# Patient Record
Sex: Male | Born: 1935
Health system: Southern US, Community
[De-identification: ages and names within clinical notes are randomized; demographics above are authoritative.]

## PROBLEM LIST (undated history)

## (undated) DIAGNOSIS — I10 Essential (primary) hypertension: Secondary | ICD-10-CM

## (undated) DIAGNOSIS — J449 Chronic obstructive pulmonary disease, unspecified: Secondary | ICD-10-CM

## (undated) DIAGNOSIS — H3530 Unspecified macular degeneration: Secondary | ICD-10-CM

## (undated) HISTORY — DX: Unspecified macular degeneration: H35.30

## (undated) HISTORY — PX: OTHER SURGICAL HISTORY: SHX169

## (undated) HISTORY — DX: Chronic obstructive pulmonary disease, unspecified: J44.9

## (undated) HISTORY — PX: ROTATOR CUFF REPAIR: SHX139

## (undated) HISTORY — PX: LUMBAR SPINE SURGERY: SHX701

---

## 1999-07-22 ENCOUNTER — Encounter: Admission: RE | Admit: 1999-07-22 | Discharge: 1999-08-14 | Payer: Self-pay | Admitting: Anesthesiology

## 1999-11-04 ENCOUNTER — Encounter: Payer: Self-pay | Admitting: Neurosurgery

## 1999-11-10 ENCOUNTER — Ambulatory Visit (HOSPITAL_COMMUNITY): Admission: RE | Admit: 1999-11-10 | Discharge: 1999-11-11 | Payer: Self-pay | Admitting: Neurosurgery

## 1999-11-10 ENCOUNTER — Encounter: Payer: Self-pay | Admitting: Neurosurgery

## 1999-11-12 ENCOUNTER — Emergency Department (HOSPITAL_COMMUNITY): Admission: EM | Admit: 1999-11-12 | Discharge: 1999-11-12 | Payer: Self-pay | Admitting: Emergency Medicine

## 2001-04-19 ENCOUNTER — Encounter: Admission: RE | Admit: 2001-04-19 | Discharge: 2001-04-19 | Payer: Self-pay | Admitting: Internal Medicine

## 2001-04-19 ENCOUNTER — Encounter: Payer: Self-pay | Admitting: Internal Medicine

## 2001-11-01 ENCOUNTER — Encounter: Admission: RE | Admit: 2001-11-01 | Discharge: 2001-11-01 | Payer: Self-pay | Admitting: Internal Medicine

## 2001-11-01 ENCOUNTER — Encounter: Payer: Self-pay | Admitting: Internal Medicine

## 2001-12-08 ENCOUNTER — Encounter: Payer: Self-pay | Admitting: Sports Medicine

## 2001-12-08 ENCOUNTER — Encounter: Admission: RE | Admit: 2001-12-08 | Discharge: 2001-12-08 | Payer: Self-pay | Admitting: Sports Medicine

## 2002-01-09 ENCOUNTER — Encounter: Admission: RE | Admit: 2002-01-09 | Discharge: 2002-01-09 | Payer: Self-pay | Admitting: Internal Medicine

## 2002-01-09 ENCOUNTER — Encounter: Payer: Self-pay | Admitting: Internal Medicine

## 2003-04-18 ENCOUNTER — Ambulatory Visit (HOSPITAL_COMMUNITY): Admission: RE | Admit: 2003-04-18 | Discharge: 2003-04-18 | Payer: Self-pay | Admitting: Gastroenterology

## 2004-08-26 ENCOUNTER — Encounter: Admission: RE | Admit: 2004-08-26 | Discharge: 2004-08-26 | Payer: Self-pay | Admitting: Family Medicine

## 2004-08-29 ENCOUNTER — Ambulatory Visit: Payer: Self-pay | Admitting: Internal Medicine

## 2004-09-08 ENCOUNTER — Ambulatory Visit: Payer: Self-pay | Admitting: Internal Medicine

## 2004-09-29 ENCOUNTER — Ambulatory Visit: Payer: Self-pay | Admitting: Internal Medicine

## 2005-03-30 ENCOUNTER — Ambulatory Visit: Payer: Self-pay | Admitting: Internal Medicine

## 2006-05-17 ENCOUNTER — Ambulatory Visit: Payer: Self-pay | Admitting: Internal Medicine

## 2006-06-28 ENCOUNTER — Ambulatory Visit: Payer: Self-pay | Admitting: Internal Medicine

## 2006-07-26 ENCOUNTER — Ambulatory Visit: Payer: Self-pay | Admitting: Internal Medicine

## 2006-08-09 ENCOUNTER — Ambulatory Visit: Payer: Self-pay | Admitting: Internal Medicine

## 2006-09-10 ENCOUNTER — Ambulatory Visit: Payer: Self-pay | Admitting: Internal Medicine

## 2007-05-03 ENCOUNTER — Ambulatory Visit: Payer: Self-pay | Admitting: Internal Medicine

## 2007-08-29 ENCOUNTER — Encounter: Payer: Self-pay | Admitting: Internal Medicine

## 2007-08-29 ENCOUNTER — Encounter (INDEPENDENT_AMBULATORY_CARE_PROVIDER_SITE_OTHER): Payer: Self-pay | Admitting: *Deleted

## 2007-09-09 ENCOUNTER — Ambulatory Visit: Payer: Self-pay | Admitting: Internal Medicine

## 2007-09-09 DIAGNOSIS — J45909 Unspecified asthma, uncomplicated: Secondary | ICD-10-CM | POA: Insufficient documentation

## 2007-09-09 DIAGNOSIS — J309 Allergic rhinitis, unspecified: Secondary | ICD-10-CM | POA: Insufficient documentation

## 2007-09-09 DIAGNOSIS — J4489 Other specified chronic obstructive pulmonary disease: Secondary | ICD-10-CM | POA: Insufficient documentation

## 2007-09-09 DIAGNOSIS — J449 Chronic obstructive pulmonary disease, unspecified: Secondary | ICD-10-CM | POA: Insufficient documentation

## 2008-02-15 ENCOUNTER — Encounter: Admission: RE | Admit: 2008-02-15 | Discharge: 2008-02-15 | Payer: Self-pay | Admitting: Family Medicine

## 2008-02-23 ENCOUNTER — Encounter: Admission: RE | Admit: 2008-02-23 | Discharge: 2008-02-23 | Payer: Self-pay | Admitting: Family Medicine

## 2008-07-05 ENCOUNTER — Ambulatory Visit: Payer: Self-pay | Admitting: Internal Medicine

## 2009-03-21 ENCOUNTER — Ambulatory Visit: Payer: Self-pay | Admitting: Internal Medicine

## 2009-05-07 ENCOUNTER — Encounter: Admission: RE | Admit: 2009-05-07 | Discharge: 2009-05-07 | Payer: Self-pay | Admitting: Family Medicine

## 2009-08-27 ENCOUNTER — Ambulatory Visit: Payer: Self-pay | Admitting: Internal Medicine

## 2009-08-28 ENCOUNTER — Telehealth: Payer: Self-pay | Admitting: Adult Health

## 2010-08-12 NOTE — Progress Notes (Signed)
Summary: prescript  Phone Note Call from Patient   Caller: Patient Call For: parrett Summary of Call: pt didn't get prescript for symbicort at [facility] Initial call taken by: Rickard Patience,  August 28, 2009 2:56 PM  Follow-up for Phone Call        rx sent. pt aware. Carron Curie CMA  August 28, 2009 3:02 PM     Prescriptions: SYMBICORT 160-4.5 MCG/ACT  AERO (BUDESONIDE-FORMOTEROL FUMARATE) Inhale 2 puffs two times a day  #1 x 3   Entered by:   Carron Curie CMA   Authorized by:   Rubye Oaks NP   Signed by:   Carron Curie CMA on 08/28/2009   Method used:   [facility]        [facility]  [address] [phone]* (retail)       [address]       [place], [place]  [address]       Ph: [phone] or [phone]       Fax: [phone]   RxID:   [id number]

## 2010-08-12 NOTE — Assessment & Plan Note (Signed)
Summary: CONGESTION///kp   Primary Provider/Referring Provider:  Dion Body  CC:  prod cough with yellow mucus, wheezing, dyspnea, and sweats x2-3weeks.  History of Present Illness: Current Problems:  C O P D (ICD-496) ALLERGIC RHINITIS (ICD-477.9) ASTHMA (ICD-493.90)  2007/10/09- One year follow-up for his respiratory problems. has done well since he saw Tammy for acute bronchitisw in October. Otherwise this has been a good year. He was stabel on meds, but now [facility] wants to change off Symbicort. we discussed meds and options. Previously tried Advair. Uses albuterol inhaler before he goes to the gym  3-4 days/ week. No routine cough, phlegm or sneeze. denies chest pain or palpitation.   07/05/08- COPD/asthma, allergic rhinitis has done ok. Recenet bronchitis. Now 3-4 days, low grade fever, cough yellow sputum, disturbing sleep. Denies myalgias, blood, sore throat. Had been using Symbicort as needed, not needed during summer. C/O cost of Proair. Prednisone helps. Had seasonal flu vax. Discussed H1N1.  March 21, 2009 --Presents for nasal allergies - worries about chest becoming congested. Complains of 3 days nasal congesiton, drippy nose, post nasal drainage. Using mucinex with some help. Coughing up white mucus.   August 27, 2009--Presents for an acute office visit. Complains of prod cough with yellow mucus, wheezing, dyspnea, sweats x2-3weeks. Taking otc not helping. Only uses symbicort as needed. Denies chest pain,  , orthopnea, hemoptysis, fever, n/v/d, edema, headache.   Medications Prior to Update: 1)  Symbicort 160-4.5 Mcg/act  Aero (Budesonide-Formoterol Fumarate) .... Inhale 2 Puffs Two Times A Day 2)  Proair Hfa 108 (90 Base) Mcg/act  Aers (Albuterol Sulfate) .... 2 Puffs Four Times A Day As Needed 3)  Zithromax Z-Pak 250 Mg Tabs (Azithromycin) .... Take As Directed.  Current Medications (verified): 1)  Symbicort 160-4.5 Mcg/act  Aero (Budesonide-Formoterol Fumarate) ....  Inhale 2 Puffs Two Times A Day 2)  Proair Hfa 108 (90 Base) Mcg/act  Aers (Albuterol Sulfate) .... 2 Puffs Four Times A Day As Needed  Allergies (verified): No Known Drug Allergies  Past History:  Past Medical History: Last updated: 07/05/2008 C O P D (ICD-496) ALLERGIC RHINITIS (ICD-477.9) ASTHMA (ICD-493.90)    Past Surgical History: Last updated: 07/05/2008 Repair deviated septum Lumbar spine surgery-stenosis  Family History: Last updated: 2007-10-09 Father died age 60- prostate cancer  Social History: Last updated: 2007/10/09 Patient states former smoker.  Worked Location manager  Risk Factors: Smoking Status: quit (2007/10/09)  Review of Systems      See HPI  Vital Signs:  Patient profile:   77 year old male Height:      70.5 inches Weight:      218.13 pounds BMI:     30.97 O2 Sat:      97 % on Room air Temp:     98.4 degrees F oral Pulse rate:   70 / minute BP sitting:   118 / 74  (right arm) Cuff size:   regular  Vitals Entered By: Boone Master CNA (August 27, 2009 4:23 PM)  O2 Flow:  Room air CC: prod cough with yellow mucus, wheezing, dyspnea, sweats x2-3weeks Is Patient Diabetic? No Comments Medications reviewed with patient Daytime contact number verified with patient. Boone Master CNA  August 27, 2009 4:24 PM    Physical Exam  Additional Exam:  General: A/Ox3; pleasant and cooperative, NAD, overweight SKIN: no rash, lesions NODES: no lymphadenopathy HEENT: [place]/AT, EOM- WNL, Conjuctivae- clear, PERRLA, TM-WNL, Nose- pale clear drainage, nontender sinus,  Throat- clear and wnl ,  NECK: Supple w/  fair ROM, JVD- none, normal carotid impulses w/o bruits Thyroid- normal to palpation CHEST: Coarse BS w/ exp wheezing  HEART: RRR, no m/g/r heard ABDOMEN: Soft and nl; nml bowel sounds; no organomegaly or masses noted ZOX:WRUE, nl pulses, no edema  NEURO: Grossly intact to observation      Impression & Recommendations:  Problem # 1:  C  O P D (ICD-496) Exacerbation  REC:  Use  Symbicort 2 puffs two times a day  Mucinex DM two times a day as needed cough/congestion  Augmentin 875mg  two times a day for 7 days, take w/ food  Prednisone taper over next week.  Please contact office for sooner follow up if symptoms do not improve or worsen  follow up Dr. Maple Hudson in 2 months   Medications Added to Medication List This Visit: 1)  Augmentin 875-125 Mg Tabs (Amoxicillin-pot clavulanate) .Marland Kitchen.. 1 by mouth two times a day 2)  Prednisone 10 Mg Tabs (Prednisone) .... 4 tabs for 2 days, then 3 tabs for 2 days, 2 tabs for 2 days, then 1 tab for 2 days, then stop  Other Orders: Est. Patient Level IV (45409)  Patient Instructions: 1)  Use  Symbicort 2 puffs two times a day  2)  Mucinex DM two times a day as needed cough/congestion  3)  Augmentin 875mg  two times a day for 7 days, take w/ food  4)  Prednisone taper over next week.  5)  Please contact office for sooner follow up if symptoms do not improve or worsen  6)  follow up Dr. Maple Hudson in 2 months  Prescriptions: PREDNISONE 10 MG TABS (PREDNISONE) 4 tabs for 2 days, then 3 tabs for 2 days, 2 tabs for 2 days, then 1 tab for 2 days, then stop  #20 x 0   Entered and Authorized by:   Rubye Oaks NP   Signed by:   Tammy Parrett NP on 08/27/2009   Method used:   [facility]        [facility] [address] [phone]* (retail)       [address]       [place], [place]  [address]       Ph: [phone] or [phone]       Fax: [phone]   RxID:   [id number] AUGMENTIN 875-125 MG TABS (AMOXICILLIN-POT CLAVULANATE) 1 by mouth two times a day  #14 x 0   Entered and Authorized by:   Rubye Oaks NP   Signed by:   Tammy Parrett NP on 08/27/2009   Method used:   [facility]        [facility] [address] [phone]* (retail)       [address]       [place], [place]  [address]       Ph: [phone] or [phone]       Fax:  [phone]   RxID:   [id number]    Immunization History:  Influenza Immunization History:    Influenza:  historical (04/12/2009)   Appended Document: neb documentation    Clinical Lists Changes  Orders: Added new Service order of Nebulizer Tx (15176) - Signed

## 2010-10-02 ENCOUNTER — Ambulatory Visit
Admission: RE | Admit: 2010-10-02 | Discharge: 2010-10-02 | Disposition: A | Discharge status: Home / Self Care | Payer: MEDICARE | Source: Ambulatory Visit | Attending: Orthopaedic Surgery | Admitting: Orthopaedic Surgery

## 2010-10-02 ENCOUNTER — Other Ambulatory Visit: Payer: Self-pay | Admitting: Orthopaedic Surgery

## 2010-10-02 DIAGNOSIS — M549 Dorsalgia, unspecified: Secondary | ICD-10-CM

## 2010-10-02 DIAGNOSIS — M79606 Pain in leg, unspecified: Secondary | ICD-10-CM

## 2010-11-25 NOTE — Assessment & Plan Note (Signed)
[facility]                             PULMONARY OFFICE NOTE   Robert Doyle, Robert Doyle                       MRN:          [id number]  DATE:05/03/2007                            DOB:          04-Sep-1933    HISTORY OF PRESENT ILLNESS:  The patient is a 77 year old white male  patient of Dr. Maple Doyle who has a known history of COPD with an asthmatic  component.  He presents today for an acute office visit.  The patient  complains that over the last 3 days he has had increased minimally  productive cough, wheezing, and shortness of breath.  The patient denies  any fever, purulent sputum, chest pain, orthopnea, PND, or leg swelling.  The patient reports he is out of Advair.  There was some confusion  because patient had recently been switched over from Advair to  Symbicort.  However, the patient does not remember taking Symbicort.  It  appears that patient only takes Advair on an as-needed basis.   PAST MEDICAL HISTORY:  Reviewed.   CURRENT MEDICATIONS:  Reviewed.   PHYSICAL EXAMINATION:  GENERAL:  The patient is a male in no acute  distress.  VITAL SIGNS:  Afebrile with stable vital signs.  O2 saturation 98% on  room air.  HEENT:  Unremarkable.  NECK:  Supple without cervical nodes or JVD.  LUNGS:  Coarse breath sounds without crackles.  CARDIAC:  Regular rate.  ABDOMEN:  Soft and nontender.  EXTREMITIES:  Warm without edema.   IMPRESSION AND PLAN:  Acute asthmatic bronchitic exacerbation.  The  patient will begin a prednisone taper for the next week.  Steroid talk  given.  Mucinex DM twice daily,  Xopenex treatment was given today in  the office.  The patient will return back with Dr. Maple Doyle as scheduled or  sooner if needed.      Rubye Oaks, NP  Electronically Signed      Robert D. Robert Hudson, MD, Tonny Bollman, FACP  Electronically Signed   TP/MedQ  DD: 05/03/2007  DT: 05/04/2007  Job #: [id number]

## 2010-11-28 NOTE — Op Note (Signed)
[place]  Patient:    Robert Doyle, Robert Doyle                       MRN: [id number] Proc. Date: 11/10/99 Adm. Date:  [id number] Attending:  Donn Pierini                           Operative Report  PREOPERATIVE DIAGNOSIS:  Bilateral L4-5 stenosis with radiculopathy.  POSTOPERATIVE DIAGNOSIS:  Bilateral L4-5 stenosis with radiculopathy.  PROCEDURE:  Bilateral L4-5 decompressive laminotomies with foraminotomies.  SURGEON:  Julio Sicks, M.D.  ASSISTANT:  Donalee Citrin, M.D. DD:  11/10/99 TD:  11/11/99 Job: [id number]

## 2010-11-28 NOTE — Assessment & Plan Note (Signed)
[facility]                               PULMONARY OFFICE NOTE   Robert Doyle, Robert Doyle                       MRN:          [id number]  DATE:05/17/2006                            DOB:          05-22-1934    HISTORY OF PRESENT ILLNESS:  This is a 77 year old white male patient of Dr.  Maple Hudson who has a known history of asthma and COPD, and allergic rhinitis,  presents for an acute office visit.  The patient complains of a 1 week  history of nasal congestion, productive cough with thick yellow sputum.  The  patient has not been seen in greater than a year.  The patient has  previously been on Advair 250/50.  However, reports he has been off of it  for greater than 6 months and had been doing well without any acute flares  up until this last week.  He has required none of his albuterol rescue  inhaler up until this past week either.   PHYSICAL EXAM:  The patient is a pleasant male in no acute distress.  He is afebrile with stable vital signs.  O2 saturation is 99% on room air.  HEENT:  Nasal mucosa with some mild erythema.  Posterior pharynx with some  mild redness.  No exudate is noted.  NECK:  Supple without adenopathy.  LUNGS:  Sounds are clear without any wheezing or crackles.  CARDIAC:  Regular rate and rhythm.  ABDOMEN:  Soft and benign.  EXTREMITIES:  Warm without any edema.   IMPRESSION AND PLAN:  Acute upper respiratory infection.  The patient is to  begin Omnicef x7 days.  Add in Mucinex DM twice daily, and may use Endal HD  #8 ounces 1 to 2 teaspoons every 4 to 6 hours as needed.  The patient is  aware of sedating effect.  The patient was given a Xopenex nebulizer  treatment in the office, and the patient is recommended to restart Advair  250/50 twice daily.  Samples and prescription were given.  The patient will  recheck with Dr. Maple Hudson in 1 month or sooner if needed.     Rubye Oaks, NP  Electronically Signed    TP/MedQ  DD:  05/18/2006  DT: 05/18/2006  Job #: [id number]

## 2010-11-28 NOTE — Op Note (Signed)
   NAME:  GARLAND, HINCAPIE                          ACCOUNT NO.:  [id number]   MEDICAL RECORD NO.:  [id number]                   PATIENT TYPE:  AMB   LOCATION:  ENDO                                 FACILITY:  [facility]   PHYSICIAN:  Danise Edge, M.D.                DATE OF BIRTH:  1934-02-07   DATE OF PROCEDURE:  04/18/2003  DATE OF DISCHARGE:                                 OPERATIVE REPORT   PROCEDURE:  Diagnostic colonoscopy.   PROCEDURE INDICATION:  Mr. Josias Tomerlin is a 77 year old male, born 10/22/1933.  Mr. Hu is scheduled to undergo diagnostic colonoscopy to  evaluate guaiac positive stool.  His 1995 flexible proctosigmoidoscopy was  normal.   ENDOSCOPIST:  Charolett Bumpers, M.D.   PREMEDICATION:  1. Versed 7 mg.  2. Demerol 50 mg.   DESCRIPTION OF PROCEDURE:  After obtaining informed consent, Mr. Sees was  placed in the left lateral decubitus position.  I administered intravenous  Demerol and intravenous Versed to achieve conscious sedation for the  procedure.  The patient's blood pressure, oxygen saturation, and cardiac  rhythm were monitored throughout the procedure and documented in the medical  record.   Anal inspection was normal.  Digital rectal exam was normal.  The Olympus  adjustable pediatric colonoscope was introduced into the rectum and advanced  to the cecum.  Colonic preparation for the exam today was excellent.   RECTUM:  Normal.  SIGMOID COLON AND DESCENDING COLON:  Normal.  SPLENIC FLEXURE:  Normal.  TRANSVERSE COLON:  Normal.  HEPATIC FLEXURE:  Normal.  ASCENDING COLON:  Normal.  CECUM AND ILEOCECAL VALVE:  Normal.    ASSESSMENT:  1. Normal proctocolonoscopy to the cecum.  2. No endoscopic evidence for the presence of colorectal neoplasia,     inflammatory bowel disease, or gastrointestinal bleeding.                                               Danise Edge, M.D.    MJ/MEDQ  D:  04/18/2003  T:  04/18/2003  Job:  [id number]   cc:   Bryan Lemma. Manus Gunning, M.D.  [address] [place]  [place] [address]  Fax: [phone]

## 2010-11-28 NOTE — Op Note (Signed)
[place]  Patient:    Robert Doyle, Robert Doyle                       MRN: [id number] Proc. Date: 11/10/99 Adm. Date:  [id number] Attending:  Donn Pierini                           Operative Report  PREOPERATIVE DIAGNOSIS:  Bilateral L4-5 stenosis with radiculopathy.  POSTOPERATIVE DIAGNOSIS:  Bilateral L4-5 stenosis with radiculopathy.  PROCEDURE:  Bilateral L4-5 decompressive laminotomy.  SURGEON:  Julio Sicks, M.D.  ASSISTANT:  Donalee Citrin, M.D.  ANESTHESIA:  General endotracheal.  INDICATIONS FOR PROCEDURE:  The patient is a 77 year old male with a history of  bilateral L5 radicular symptoms and neurogenic claudication which has failed efforts at conservative management including epidural steroid injections.  MRI scanning demonstrates relatively advanced lateral recess stenosis bilaterally at L4-5.  There is no evidence of disk herniation.  There is no evidence of spondylolisthesis.  The patient presents now for bilateral decompressive laminotomies for hopeful improvement of his symptoms.  DESCRIPTION OF PROCEDURE:  The patient was brought to the operating room and placed on the table in the supine position.  After general anesthesia had been obtained, the patient was positioned prone on the Wilson frame and properly padded.  The patients lumbar region was shaved and prepped sterilely.  A #10 blade was used o make a linear skin incision overlying the L4-5 interspace.  This was carried down sharply in the midline.  Subperiosteal dissection was then performed bilaterally, exposing the lamina and fact joints of L4 and L5.  A deep self retaining retractor was placed.  An x-ray was taken and the level was confirmed.  A laminotomy was hen performed bilaterally using the high speed drill and Kerrison rongeurs to remove the inferior 1/2 of the lamina of L4, the medial 1/3 of the L4-5 facet joint and the superior rim of the L5 lamina.   ligamentum flavum was then elevated and resected in a piece meal fashion using Kerrison rongeurs.  The underlying thecal sac and exiting L5 nerve roots were identified bilateral.  The microscope was brought onto the field and used for microdissection of the L5 nerve roots. The remaining aspects of the superior facet of L5 and the ligamentum flavum were then undercut, widely decompressing the lateral recess and completely decompressing he L5 nerve root bilaterally.  The disk space was inspected bilateral and found to be free of any evidence of herniation.  At this point, a blunt probe was passed easily out each neural foramen.  There was no evidence of any continuing compression. The L4 foramen appeared patent, as well.  There was no evidence of injury to the thecal sac or nerve roots.  The wound was then copiously irrigated with antibiotic solution.  Gelfoam was placed topically for hemostasis, which was found to be good. The microscope and retractors were removed.  Hemostasis in the muscle was achieved with electrocautery.  The wound was then closed in layers with Vicryl sutures.  Staples were applied to the surface.  There were no operative complications. The patient tolerated the procedure well and returned to the recovery room postoperatively. DD:  11/10/99 TD:  11/11/99 Job: [id number]

## 2010-11-28 NOTE — Assessment & Plan Note (Signed)
[facility]                             PULMONARY OFFICE NOTE   KERIN, CECCHI                       MRN:          [id number]  DATE:07/26/2006                            DOB:          1934/02/10    PROBLEMS:  1. Asthma/chronic obstructive pulmonary disease.  2. Allergic rhinitis.   HISTORY:  He is still using the same sample of Advair that he has had  for quite awhile and clearly is not using it very regularly.  I  discussed expectations and medication use again today.  He says there  has been persistent scant yellow sputum with productive cough,  especially right after he showers in the morning.  The breathing  treatment, cortisone, and Avelox give in the middle of December does not  seem to have made a huge difference.  He is aware of much sinus drip but  no reflux, no headache.  He gets short of breath trying to exercise.  We  discussed exercise for stamina as opposed to strength.  Mostly, he has  been trying to lift weights.   MEDICATIONS:  1. Advair used irregularly at 250/50.  2. Albuterol rescue inhaler.  This has not been used in quite awhile,      and he is not sure it still works.  3. Lovastatin 40 mg.   OBJECTIVE:  VITAL SIGNS:  Weight 213 pounds.  Blood pressure 118/88,  pulse regular and 70, room air saturation 97%.  LUNGS:  Breathing is unlabored.  Breath sounds are a little coarse and  diminished without cough, dullness, or real rhonchi.  There is no  adenopathy, no edema.   IMPRESSION:  Chronic bronchitis, probably with some recent exacerbation.  I cannot completely exclude some sinusitis, but I do not think so.   PLAN:  1. We discussed environmental precautions, management of bronchitis,      and options.  2. Try changing Advair to Symbicort 160/4.5, with the emphasis on      using one puff b.i.d. and rinsing mouth everyday until this is used      up before making decision as to whether it helps.  3. Samples of Xyzal  for postnasal drainage, 5 mg daily.  4. Schedule pulmonary function test.  5. He was given a booklet with information on COPD.  6. Return in two to three months, earlier p.r.n.    Clinton D. Maple Hudson, MD, Tonny Bollman, FACP  Electronically Signed   CDY/MedQ  DD: 07/26/2006  DT: 07/27/2006  Job #: [id number]   cc:   Bryan Lemma. Manus Gunning, M.D.

## 2010-11-28 NOTE — Assessment & Plan Note (Signed)
 [facility]                             PULMONARY OFFICE NOTE   Robert Doyle, Robert Doyle                       MRN:          [id number]  DATE:06/28/2006                            DOB:          05/31/1934    PROBLEM:  1. Asthma/chronic obstructive pulmonary disease.  2. Allergic rhinitis.   HISTORY:  He had seen the nurse practitioner here November 5 but never  felt he really cleared at that time on Omnicef and Mucinex.  In the past  week, he has had worsening cough, green sputum and head congestion with  low-grade fever.  He feels some productive cough year around.  Advair is  used irregularly.   MEDICATION:  Lovastatin 40 mg.  He is off of albuterol and has not been  using Advair since last year.   There are no medication allergies.   OBJECTIVE:  Weight 216 pounds.  Blood pressure 138/92.  Pulse regular  56.  Room air saturation 100%.  Coarse breath sounds diffusely, unlabored.  HEART:  Sounds regular without murmur.  There is no visible postnasal drainage, no adenopathy, no edema.   IMPRESSION:  Exacerbation of chronic bronchitis.  I cannot exclude a low-  grade sinus infection as well.   PLAN:  Chest x-ray, nebulizer treatment with albuterol.  Depo-Medrol 80  mg IM.  Prednisone 8 day taper from 40 mg, with steroid talk done.  Avalox 400 mg for 7 days.  Schedule a return in 1 month to look at what  maintenance therapy should be provided, earlier p.r.n.     Clinton D. Maple Hudson, MD, Tonny Bollman, FACP  Electronically Signed    CDY/MedQ  DD: 07/07/2006  DT: 07/07/2006  Job #: [id number]   cc:   Bryan Lemma. Manus Gunning, M.D.

## 2010-11-28 NOTE — Assessment & Plan Note (Signed)
[facility]                             PULMONARY OFFICE NOTE   JSON, KOELZER                       MRN:          [id number]  DATE:09/10/2006                            DOB:          October 17, 1933    PROBLEM LIST:  1. Asthma/chronic obstructive pulmonary disease.  2. Allergic rhinitis.   HISTORY:  He has had a cold, coughing white sputum, without fever,  chills or purulent discharge, muscle aches or GI upset.  He returns now  after a pulmonary function testing, asking if he can have a prednisone  prescription to hold on to.  We discussed this.  Symbicort sample  helped.   MEDICATIONS:  1. Lovastatin.  2. Advair 250/50 once daily.   OBJECTIVE:  Weight 219 pounds, BP 138/80, pulse regular 68, room air  saturation 99%.  He looks bleary and puffy, with mild cough, mild nasal congestion.  No  wheeze, good airflow.  Throat is not red.  There is no obvious postnasal  drainage.  Heart sounds are normal.   PFT:  Normal spirometry without significant response to bronchodilator,  normal measured lung volumes, normal diffusion capacity.   IMPRESSION:  1. Viral syndrome with bronchitis.  2. Normal pulmonary function tests despite having a cold.  I am      dropping a designation of chronic obstructive pulmonary disease,      and indicating his primary respiratory complaint as better      described as an asthma with intermittent bronchitis.   PLAN:  1. Refill prescription Symbicort 160/4.5 two puffs b.i.d.  2. Phenergan with codeine 200 ml, 5 ml q.6 h. p.r.n. cough.  3. Prednisone 8 day taper from 40 mg to hold, with careful steroid      discussion, again, done.  4. Schedule return 1 year/p.r.n.     Clinton D. Maple Hudson, MD, Tonny Bollman, FACP  Electronically Signed    CDY/MedQ  DD: 09/11/2006  DT: 09/11/2006  Job #: [id number]   cc:   Bryan Lemma. Manus Gunning, M.D.

## 2011-04-20 ENCOUNTER — Other Ambulatory Visit: Payer: Self-pay | Admitting: Orthopedic Surgery

## 2011-04-20 DIAGNOSIS — M545 Low back pain, unspecified: Secondary | ICD-10-CM

## 2011-04-20 DIAGNOSIS — M79604 Pain in right leg: Secondary | ICD-10-CM

## 2011-04-22 ENCOUNTER — Ambulatory Visit
Admission: RE | Admit: 2011-04-22 | Discharge: 2011-04-22 | Disposition: A | Discharge status: Home / Self Care | Payer: Medicare Other | Source: Ambulatory Visit | Attending: Orthopedic Surgery | Admitting: Orthopedic Surgery

## 2011-04-22 VITALS — BP 120/43 | HR 66

## 2011-04-22 DIAGNOSIS — M79604 Pain in right leg: Secondary | ICD-10-CM

## 2011-04-22 DIAGNOSIS — M545 Low back pain, unspecified: Secondary | ICD-10-CM

## 2011-04-22 MED ORDER — ONDANSETRON HCL 4 MG/2ML IJ SOLN
4.0000 mg | Freq: Once | INTRAMUSCULAR | Status: AC
  Administered 2011-04-22: 4 mg via INTRAMUSCULAR

## 2011-04-22 MED ORDER — MEPERIDINE HCL 100 MG/ML IJ SOLN
75.0000 mg | Freq: Once | INTRAMUSCULAR | Status: AC
  Administered 2011-04-22: 75 mg via INTRAMUSCULAR

## 2011-04-22 MED ORDER — DIAZEPAM 5 MG PO TABS
5.0000 mg | ORAL_TABLET | Freq: Once | ORAL | Status: AC
  Administered 2011-04-22: 5 mg via ORAL

## 2011-04-22 MED ORDER — IOHEXOL 180 MG/ML  SOLN
16.0000 mL | Freq: Once | INTRAMUSCULAR | Status: AC | PRN
  Administered 2011-04-22: 16 mL via INTRATHECAL

## 2011-04-22 NOTE — Progress Notes (Signed)
208p pt sat up to get dressed, became very pale and diaphoretic. layed pt down and got bp, it was low, Pt also states he hasn't eaten since 6pm last night. Crackers and coke taken slowly. 218p bp coming up pt not as pale, dr. Deanne Coffer made aware. 225p, pt sitting ups color is much improved. skin is dry. Clothes changed, wife at bedside. 230 p. Pt states he's ready to leave and is walked to the door, gait is steady.

## 2011-06-17 ENCOUNTER — Encounter: Payer: Self-pay | Admitting: Adult Health

## 2011-06-17 ENCOUNTER — Telehealth: Payer: Self-pay | Admitting: Internal Medicine

## 2011-06-17 NOTE — Telephone Encounter (Signed)
Called spoke with patient who c/o prod cough with clear mucus and wheezing x2weeks.  Denies SOB, f/c/s.  Requesting appt.  No openings with CDY this week, TP not in office until 12.6.12.  appt scheduled with TP 12.6.12 @ 1430.  Pt has not seen CDY since 2009 (saw TP 2011) > appt scheduled with CDY 12.28.12 @ 1415.  Pt aware must keep this appt.  Will sign off on message.

## 2011-06-18 ENCOUNTER — Ambulatory Visit (INDEPENDENT_AMBULATORY_CARE_PROVIDER_SITE_OTHER): Payer: Medicare Other | Admitting: Adult Health

## 2011-06-18 ENCOUNTER — Ambulatory Visit (INDEPENDENT_AMBULATORY_CARE_PROVIDER_SITE_OTHER)
Admission: RE | Admit: 2011-06-18 | Discharge: 2011-06-18 | Disposition: A | Discharge status: Home / Self Care | Payer: Medicare Other | Source: Ambulatory Visit | Attending: Adult Health | Admitting: Adult Health

## 2011-06-18 ENCOUNTER — Encounter: Payer: Self-pay | Admitting: Adult Health

## 2011-06-18 VITALS — BP 108/66 | HR 68 | Temp 97.7°F | Ht 70.0 in | Wt 217.2 lb

## 2011-06-18 DIAGNOSIS — J449 Chronic obstructive pulmonary disease, unspecified: Secondary | ICD-10-CM

## 2011-06-18 DIAGNOSIS — J4489 Other specified chronic obstructive pulmonary disease: Secondary | ICD-10-CM

## 2011-06-18 MED ORDER — LEVALBUTEROL HCL 0.63 MG/3ML IN NEBU
0.6300 mg | INHALATION_SOLUTION | Freq: Once | RESPIRATORY_TRACT | Status: AC
  Administered 2011-06-18: 0.63 mg via RESPIRATORY_TRACT

## 2011-06-18 MED ORDER — DOXYCYCLINE HYCLATE 100 MG PO TABS: 100.0000 mg | ORAL_TABLET | Freq: Two times a day (BID) | ORAL | Status: AC

## 2011-06-18 MED ORDER — PREDNISONE 10 MG PO TABS: ORAL_TABLET | ORAL | Status: DC

## 2011-06-18 MED ORDER — HYDROCODONE-HOMATROPINE 5-1.5 MG/5ML PO SYRP: 5.0000 mL | ORAL_SOLUTION | Freq: Four times a day (QID) | ORAL | Status: AC | PRN

## 2011-06-18 NOTE — Assessment & Plan Note (Signed)
Flare ,  If not improved will need to consider changing off ACE inhibitor as may be aggravating cough/wheezing  Xray today -former smoker.   Plan:  Doxycycline 100mg  Twice daily  For 7 days  Mucinex DM Twice daily  As needed  Cough/congestion  Prednisone taper over next week.  Hydromet 1-2 tsp every 4-6 hr As needed  Cough, may make you sleepy.  Discuss with your family doctor about your blood pressure pill- Lisinopril -if your cough does not go away.  Please contact office for sooner follow up if symptoms do not improve or worsen or seek emergency care  follow up Dr. Maple Hudson  4-6 weeks and As needed

## 2011-06-18 NOTE — Progress Notes (Signed)
Subjective:    Patient ID: Robert Doyle, male    DOB: 1933-09-07, 77 y.o.   MRN: [id number]  HPI 77 yo with known hx of COPD /asthma   09/09/07-  One year follow-up for his respiratory problems. has done well since he saw Isidro Monks for acute bronchitisw in October. Otherwise this has been a good year. He was stabel on meds, but now [facility] wants to change off Symbicort. we discussed meds and options. Previously tried Advair. Uses albuterol inhaler before he goes to the gym 3-4 days/ week. No routine cough, phlegm or sneeze. denies chest pain or palpitation.   07/05/08- COPD/asthma, allergic rhinitis  has done ok. Recenet bronchitis. Now 3-4 days, low grade fever, cough yellow sputum, disturbing sleep. Denies myalgias, blood, sore throat. Had been using Symbicort as needed, not needed during summer. C/O cost of Proair. Prednisone helps. Had seasonal flu vax. Discussed H1N1.   March 21, 2009 --Presents for nasal allergies - worries about chest becoming congested. Complains of 3 days nasal congesiton, drippy nose, post nasal drainage. Using mucinex with some help. Coughing up white mucus.   August 27, 2009--Presents for an acute office visit. Complains of prod cough with yellow mucus, wheezing, dyspnea, sweats x2-3weeks. Taking otc not helping. Only uses symbicort as needed. Denies chest pain, , orthopnea, hemoptysis, fever, n/v/d, edema, headache.   06/18/2011 Acute OV  Complains of prod cough with light green mucus, wheezing x 2 weeks. OTC not helping.  Cough is keeping him up at night. No hemoptysis or weight loss.  Cough and wheezing are getting worse. No fever. Mucus is mainly clear to white.   Of note was started on ACE inhibtor 1 month ago.     Review of Systems Constitutional:   No  weight loss, night sweats,  Fevers, chills,  ++fatigue, or  lassitude.  HEENT:   No headaches,  Difficulty swallowing,  Tooth/dental problems, or  Sore throat,                No sneezing, itching, ear  ache,  +nasal congestion, post nasal drip,   CV:  No chest pain,  Orthopnea, PND, swelling in lower extremities, anasarca, dizziness, palpitations, syncope.   GI  No heartburn, indigestion, abdominal pain, nausea, vomiting, diarrhea, change in bowel habits, loss of appetite, bloody stools.   Resp:    No coughing up of blood.     No chest wall deformity  Skin: no rash or lesions.  GU: no dysuria, change in color of urine, no urgency or frequency.  No flank pain, no hematuria   MS:  No joint pain or swelling.  No decreased range of motion.  No back pain.  Psych:  No change in mood or affect. No depression or anxiety.  No memory loss.         Objective:   Physical Exam GEN: A/Ox3; pleasant , NAD,elderly   HEENT:  [place]/AT,  EACs-clear, TMs-wnl, NOSE-clear, THROAT-clear, no lesions, no postnasal drip or exudate noted.   NECK:  Supple w/ fair ROM; no JVD; normal carotid impulses w/o bruits; no thyromegaly or nodules palpated; no lymphadenopathy.  RESP  Coarse BS w/ faint exp wheeze , + cough throughout the exam, no accessory muscle use, no dullness to percussion  CARD:  RRR, no m/r/g  , no peripheral edema, pulses intact, no cyanosis or clubbing.  GI:   Soft & nt; nml bowel sounds; no organomegaly or masses detected.  Musco: Warm bil, no deformities or joint swelling noted.  Neuro: alert, no focal deficits noted.    Skin: Warm, no lesions or rashes         Assessment & Plan:

## 2011-06-18 NOTE — Progress Notes (Signed)
Addended by: Boone Master E on: 06/18/2011 03:57 PM   Modules accepted: Orders

## 2011-06-18 NOTE — Patient Instructions (Signed)
Doxycycline 100mg  Twice daily  For 7 days  Mucinex DM Twice daily  As needed  Cough/congestion  Prednisone taper over next week.  Hydromet 1-2 tsp every 4-6 hr As needed  Cough, may make you sleepy.  Discuss with your family doctor about your blood pressure pill- Lisinopril -if your cough does not go away.  Please contact office for sooner follow up if symptoms do not improve or worsen or seek emergency care  follow up Dr. Maple Hudson  4-6 weeks and As needed

## 2011-06-18 NOTE — Progress Notes (Signed)
Addended by: Boone Master E on: 06/18/2011 05:30 PM   Modules accepted: Orders

## 2011-06-22 ENCOUNTER — Telehealth: Payer: Self-pay | Admitting: Adult Health

## 2011-06-22 NOTE — Telephone Encounter (Signed)
Looks like pt was returning Jess's call regarding cxr results.  Reviewed these results and saw documentation that Sharlynn Oliphant has already spoke with pt as of 4:09pm today informing him of cxr results and TP's recs.  Therefore will sign off on this message.

## 2011-07-10 ENCOUNTER — Ambulatory Visit: Payer: Medicare Other | Admitting: Internal Medicine

## 2011-07-17 ENCOUNTER — Ambulatory Visit (INDEPENDENT_AMBULATORY_CARE_PROVIDER_SITE_OTHER): Payer: Medicare Other | Admitting: Internal Medicine

## 2011-07-17 ENCOUNTER — Encounter: Payer: Self-pay | Admitting: Internal Medicine

## 2011-07-17 VITALS — BP 122/72 | HR 82 | Ht 70.0 in | Wt 217.0 lb

## 2011-07-17 DIAGNOSIS — J4489 Other specified chronic obstructive pulmonary disease: Secondary | ICD-10-CM

## 2011-07-17 DIAGNOSIS — J309 Allergic rhinitis, unspecified: Secondary | ICD-10-CM

## 2011-07-17 DIAGNOSIS — J449 Chronic obstructive pulmonary disease, unspecified: Secondary | ICD-10-CM

## 2011-07-17 MED ORDER — PREDNISONE 10 MG PO TABS: ORAL_TABLET | ORAL | Status: DC

## 2011-07-17 MED ORDER — PHENYLEPHRINE HCL 1 % NA SOLN
3.0000 [drp] | Freq: Four times a day (QID) | NASAL | Status: DC | PRN
  Administered 2011-07-17: 3 [drp] via NASAL

## 2011-07-17 MED ORDER — AMOXICILLIN-POT CLAVULANATE 875-125 MG PO TABS: 1.0000 | ORAL_TABLET | Freq: Two times a day (BID) | ORAL | Status: AC

## 2011-07-17 MED ORDER — METHYLPREDNISOLONE ACETATE 80 MG/ML IJ SUSP
80.0000 mg | Freq: Once | INTRAMUSCULAR | Status: AC
  Administered 2011-07-17: 80 mg via INTRAMUSCULAR

## 2011-07-17 NOTE — Patient Instructions (Signed)
Script sent for Augmentin antibiotic  Script sent for prednisone taper  Depo 80  Neb neo nasal  Plenty of fluids to thin mucus  It is ok for just 2-3 days to use Afrin nasal decongestant spray if needed

## 2011-07-17 NOTE — Progress Notes (Signed)
Patient ID: Robert Doyle, male    DOB: 06/18/1934, 78 y.o.   MRN: [id number]  HPI 78 yo with known hx of COPD /asthma   09/09/07-  One year follow-up for his respiratory problems. has done well since he saw Tammy for acute bronchitis in October. Otherwise this has been a good year. He was stabel on meds, but now [facility] wants to change off Symbicort. we discussed meds and options. Previously tried Advair. Uses albuterol inhaler before he goes to the gym 3-4 days/ week. No routine cough, phlegm or sneeze. denies chest pain or palpitation.   07/05/08- COPD/asthma, allergic rhinitis  has done ok. Recenet bronchitis. Now 3-4 days, low grade fever, cough yellow sputum, disturbing sleep. Denies myalgias, blood, sore throat. Had been using Symbicort as needed, not needed during summer. C/O cost of Proair. Prednisone helps. Had seasonal flu vax. Discussed H1N1.   March 21, 2009 --Presents for nasal allergies - worries about chest becoming congested. Complains of 3 days nasal congesiton, drippy nose, post nasal drainage. Using mucinex with some help. Coughing up white mucus.   August 27, 2009--Presents for an acute office visit. Complains of prod cough with yellow mucus, wheezing, dyspnea, sweats x2-3weeks. Taking otc not helping. Only uses symbicort as needed. Denies chest pain, , orthopnea, hemoptysis, fever, n/v/d, edema, headache.   06/18/2011 Acute OV  Complains of prod cough with light green mucus, wheezing x 2 weeks. OTC not helping.  Cough is keeping him up at night. No hemoptysis or weight loss.  Cough and wheezing are getting worse. No fever. Mucus is mainly clear to white.  Of note was started on ACE inhibtor 1 month ago.   07/17/11- 77 yom former smoker followed for allergic rhinitis, Asthma/ COPD Last saw NP on 06/18/11. Got only temporary help from that Rx w/ prednisone, doxycycline, cough syrup. Still complains of head and chest congestion, blowing and coughing yellow mucus, no  blood fever or pain. Not wheezing. Chest x-ray: 06/18/2011-COPD, no acute process Had lumbar spine surgery 10/20/2010 without respiratory complication.    Review of Systems Constitutional:   No  weight loss, night sweats,  Fevers, chills,  ++fatigue, or  lassitude.  HEENT:   No headaches,  Difficulty swallowing,  Tooth/dental problems, or  Sore throat,                No sneezing, itching, ear ache,  +nasal congestion, post nasal drip,   CV:  No chest pain,  Orthopnea, PND, swelling in lower extremities, anasarca, dizziness, palpitations, syncope.   GI  No heartburn, indigestion, abdominal pain, nausea, vomiting, diarrhea, change in bowel habits, loss of appetite, bloody stools.   Resp:    No coughing up of blood.     No chest wall deformity + cough yellow sputum  Skin: no rash or lesions.  GU: no dysuria, change in color of urine, no urgency or frequency.  No flank pain, no hematuria   MS:  No joint pain or swelling.  No decreased range of motion.  No back pain.  Psych:  No change in mood or affect. No depression or anxiety.  No memory loss.     Objective:  General- Alert, Oriented, Affect-appropriate, Distress- none acute Skin- rash-none, lesions- none, excoriation- none Lymphadenopathy- none Head- atraumatic            Eyes- Gross vision intact, PERRLA, conjunctivae clear secretions            Ears- Hearing, canals-normal  Nose- Clear, no-Septal dev, mucus, polyps, erosion, perforation             Throat- Mallampati II , mucosa clear , drainage- none, tonsils- atrophic, hoarse Neck- flexible , trachea midline, no stridor , thyroid nl, carotid no bruit Chest - symmetrical excursion , unlabored           Heart/CV- RRR , no murmur , no gallop  , no rub, nl s1 s2                           - JVD- none , edema- none, stasis changes- none, varices- none           Lung- coarse breath sounds, wheeze- none, cough- none , dullness-none, rub- none           Chest wall-    Abd- tender-no, distended-no, bowel sounds-present, HSM- no Br/ Gen/ Rectal- Not done, not indicated Extrem- cyanosis- none, clubbing, none, atrophy- none, strength- nl Neuro- grossly intact to observation

## 2011-07-20 NOTE — Assessment & Plan Note (Addendum)
Sustained exacerbation probably triggered by the viral illnesses going to the community now. There are upper airway infection and bronchitis components. Plan Depo-Medrol, Augmentin, fluids, Mucinex, nebulizer

## 2011-10-15 ENCOUNTER — Telehealth: Payer: Self-pay | Admitting: Internal Medicine

## 2011-10-15 NOTE — Telephone Encounter (Signed)
I spoke with pt and he states he has been coughing and wanted to come in and see cdy. I advised him he was currently not in the office but he could see TP. I offered apt for tomorrow but stated he wanted to come in on Monday. He is scheduled to see TP 10/19/11 at 12:00 for an evaluation. Nothing further was needed

## 2011-10-19 ENCOUNTER — Ambulatory Visit (INDEPENDENT_AMBULATORY_CARE_PROVIDER_SITE_OTHER): Payer: Medicare Other | Admitting: Adult Health

## 2011-10-19 ENCOUNTER — Encounter: Payer: Self-pay | Admitting: Adult Health

## 2011-10-19 ENCOUNTER — Telehealth: Payer: Self-pay | Admitting: Internal Medicine

## 2011-10-19 VITALS — BP 118/78 | HR 90 | Temp 97.7°F | Ht 70.5 in | Wt 219.6 lb

## 2011-10-19 DIAGNOSIS — J449 Chronic obstructive pulmonary disease, unspecified: Secondary | ICD-10-CM

## 2011-10-19 MED ORDER — HYDROCODONE-HOMATROPINE 5-1.5 MG/5ML PO SYRP: 5.0000 mL | ORAL_SOLUTION | Freq: Four times a day (QID) | ORAL | Status: AC | PRN

## 2011-10-19 MED ORDER — ALBUTEROL SULFATE HFA 108 (90 BASE) MCG/ACT IN AERS: 2.0000 | INHALATION_SPRAY | RESPIRATORY_TRACT | Status: DC | PRN

## 2011-10-19 MED ORDER — BUDESONIDE-FORMOTEROL FUMARATE 160-4.5 MCG/ACT IN AERO: 2.0000 | INHALATION_SPRAY | Freq: Two times a day (BID) | RESPIRATORY_TRACT | Status: DC

## 2011-10-19 MED ORDER — AMOXICILLIN-POT CLAVULANATE 875-125 MG PO TABS: 1.0000 | ORAL_TABLET | Freq: Two times a day (BID) | ORAL | Status: AC

## 2011-10-19 MED ORDER — LEVALBUTEROL HCL 0.63 MG/3ML IN NEBU
0.6300 mg | INHALATION_SOLUTION | Freq: Once | RESPIRATORY_TRACT | Status: AC
  Administered 2011-10-19: 0.63 mg via RESPIRATORY_TRACT

## 2011-10-19 MED ORDER — PREDNISONE 10 MG PO TABS: ORAL_TABLET | ORAL | Status: DC

## 2011-10-19 NOTE — Progress Notes (Signed)
Patient ID: Robert Doyle, male    DOB: 1933/10/31, 78 y.o.   MRN: [id number]  HPI 78 yo with known hx of COPD /asthma   09/09/07-  One year follow-up for his respiratory problems. has done well since he saw Anahla Bevis for acute bronchitis in October. Otherwise this has been a good year. He was stabel on meds, but now [facility] wants to change off Symbicort. we discussed meds and options. Previously tried Advair. Uses albuterol inhaler before he goes to the gym 3-4 days/ week. No routine cough, phlegm or sneeze. denies chest pain or palpitation.   07/05/08- COPD/asthma, allergic rhinitis  has done ok. Recenet bronchitis. Now 3-4 days, low grade fever, cough yellow sputum, disturbing sleep. Denies myalgias, blood, sore throat. Had been using Symbicort as needed, not needed during summer. C/O cost of Proair. Prednisone helps. Had seasonal flu vax. Discussed H1N1.   March 21, 2009 --Presents for nasal allergies - worries about chest becoming congested. Complains of 3 days nasal congesiton, drippy nose, post nasal drainage. Using mucinex with some help. Coughing up white mucus.   August 27, 2009--Presents for an acute office visit. Complains of prod cough with yellow mucus, wheezing, dyspnea, sweats x2-3weeks. Taking otc not helping. Only uses symbicort as needed. Denies chest pain, , orthopnea, hemoptysis, fever, n/v/d, edema, headache.   06/18/2011 Acute OV  Complains of prod cough with light green mucus, wheezing x 2 weeks. OTC not helping.  Cough is keeping him up at night. No hemoptysis or weight loss.  Cough and wheezing are getting worse. No fever. Mucus is mainly clear to white.  Of note was started on ACE inhibtor 1 month ago.   07/17/11- 77 yom former smoker followed for allergic rhinitis, Asthma/ COPD Last saw NP on 06/18/11. Got only temporary help from that Rx w/ prednisone, doxycycline, cough syrup. Still complains of head and chest congestion, blowing and coughing yellow mucus, no  blood fever or pain. Not wheezing. Chest x-ray: 06/18/2011-COPD, no acute process Had lumbar spine surgery 10/20/2010 without respiratory complication.    10/19/2011 Acute OV  Complains of prod cough with green/yellow mucus, increased SOB, wheezing, tightness in chest, sore throat/hoarseness x1 week . Patient has had 3 exacerbations the last 3 months requiring antibiotics, and steroids. We discussed previously that he has on ACE inhibitor that could be exacerbating his cough. Patient also has not been taken his Symbicort on a regular basis. We also discussed medication compliance. Patient denies any hemoptysis, chest pain, orthopnea, PND, or leg swelling. Chest x-ray in December of 2012 showed no acute changes. He's been using some over-the-counter Mucinex without any relief.  Review of Systems Constitutional:   No  weight loss, night sweats,  Fevers, chills,  ++fatigue, or  lassitude.  HEENT:   No headaches,  Difficulty swallowing,  Tooth/dental problems, or  Sore throat,                No sneezing, itching, ear ache,  +nasal congestion, post nasal drip,   CV:  No chest pain,  Orthopnea, PND, swelling in lower extremities, anasarca, dizziness, palpitations, syncope.   GI  No heartburn, indigestion, abdominal pain, nausea, vomiting, diarrhea, change in bowel habits, loss of appetite, bloody stools.   Resp:    No coughing up of blood.     No chest wall deformity + cough yellow sputum  Skin: no rash or lesions.  GU: no dysuria, change in color of urine, no urgency or frequency.  No flank pain,  no hematuria   MS:  No joint pain or swelling.  No decreased range of motion.  No back pain.  Psych:  No change in mood or affect. No depression or anxiety.  No memory loss.     Objective:  GEN: A/Ox3; pleasant , NAD, well nourished , hoarse   HEENT:  [place]/AT,  EACs-clear, TMs-wnl, NOSE-clear, THROAT-clear, no lesions, no postnasal drip or exudate noted.   NECK:  Supple w/ fair ROM; no JVD;  normal carotid impulses w/o bruits; no thyromegaly or nodules palpated; no lymphadenopathy.  RESP  Coarse BS w/ upper airway psuedowheeze no accessory muscle use, no dullness to percussion  CARD:  RRR, no m/r/g  , no peripheral edema, pulses intact, no cyanosis or clubbing.  GI:   Soft & nt; nml bowel sounds; no organomegaly or masses detected.  Musco: Warm bil, no deformities or joint swelling noted.   Neuro: alert, no focal deficits noted.    Skin: Warm, no lesions or rashes

## 2011-10-19 NOTE — Progress Notes (Signed)
Addended by: Nita Sells on: 10/19/2011 12:45 PM   Modules accepted: Orders

## 2011-10-19 NOTE — Assessment & Plan Note (Addendum)
Flare -recurrent  Suspect ACE is causing flare in cough.   Plan:  Augmentin 875mg  Twice daily  For 7 days -take with food.  Mucinex DM Twice daily  As needed  Cough/congestion  Prednisone taper over next week.  Hydromet 1-2 tsp every 4-6 hr As needed  Cough, may make you sleepy.  Discuss with your family doctor about your blood pressure pill- Lisinopril -we believe it is making your cough worse.  Please contact office for sooner follow up if symptoms do not improve or worsen or seek emergency care  follow up Dr. Maple Hudson as planned and As needed

## 2011-10-19 NOTE — Telephone Encounter (Signed)
Spoke with patient-he has Rx for cough syrup to get filled, the Xopenex was a one time use here in the office; pt stated he still needs Albuterol rescue inhaler refilled as well as Symbicort inhaler. I have sent both refills to [facility] and patient is aware.

## 2011-10-19 NOTE — Patient Instructions (Addendum)
Augmentin 875mg  Twice daily  For 7 days -take with food.  Mucinex DM Twice daily  As needed  Cough/congestion  Prednisone taper over next week.  Hydromet 1-2 tsp every 4-6 hr As needed  Cough, may make you sleepy.  Discuss with your family doctor about your blood pressure pill- Lisinopril -we believe it is making your cough worse.  Please contact office for sooner follow up if symptoms do not improve or worsen or seek emergency care  follow up Dr. Maple Hudson as planned and As needed   Restart Symbicort and take 2 puffs Twice daily  -no missed doses .

## 2012-03-24 ENCOUNTER — Encounter: Payer: Self-pay | Admitting: Internal Medicine

## 2012-03-24 ENCOUNTER — Ambulatory Visit (INDEPENDENT_AMBULATORY_CARE_PROVIDER_SITE_OTHER): Payer: Medicare Other | Admitting: Internal Medicine

## 2012-03-24 VITALS — BP 120/76 | HR 79 | Ht 70.5 in | Wt 220.2 lb

## 2012-03-24 DIAGNOSIS — J069 Acute upper respiratory infection, unspecified: Secondary | ICD-10-CM

## 2012-03-24 DIAGNOSIS — J45909 Unspecified asthma, uncomplicated: Secondary | ICD-10-CM

## 2012-03-24 DIAGNOSIS — J309 Allergic rhinitis, unspecified: Secondary | ICD-10-CM

## 2012-03-24 MED ORDER — AZITHROMYCIN 250 MG PO TABS: ORAL_TABLET | ORAL | Status: AC

## 2012-03-24 MED ORDER — PHENYLEPHRINE HCL 1 % NA SOLN
3.0000 [drp] | Freq: Once | NASAL | Status: AC
  Administered 2012-03-24: 3 [drp] via NASAL

## 2012-03-24 MED ORDER — METHYLPREDNISOLONE ACETATE 80 MG/ML IJ SUSP
80.0000 mg | Freq: Once | INTRAMUSCULAR | Status: AC
  Administered 2012-03-24: 80 mg via INTRAMUSCULAR

## 2012-03-24 MED ORDER — PREDNISONE 10 MG PO TABS: ORAL_TABLET | ORAL | Status: AC

## 2012-03-24 NOTE — Progress Notes (Signed)
Patient ID: Robert Doyle, male    DOB: 12/06/1933, 78 y.o.   MRN: [id number]  HPI 78 yo with known hx of COPD /asthma   09/09/07-  One year follow-up for his respiratory problems. has done well since he saw Tammy for acute bronchitis in October. Otherwise this has been a good year. He was stabel on meds, but now [facility] wants to change off Symbicort. we discussed meds and options. Previously tried Advair. Uses albuterol inhaler before he goes to the gym 3-4 days/ week. No routine cough, phlegm or sneeze. denies chest pain or palpitation.   07/05/08- COPD/asthma, allergic rhinitis  has done ok. Recenet bronchitis. Now 3-4 days, low grade fever, cough yellow sputum, disturbing sleep. Denies myalgias, blood, sore throat. Had been using Symbicort as needed, not needed during summer. C/O cost of Proair. Prednisone helps. Had seasonal flu vax. Discussed H1N1.   March 21, 2009 --Presents for nasal allergies - worries about chest becoming congested. Complains of 3 days nasal congesiton, drippy nose, post nasal drainage. Using mucinex with some help. Coughing up white mucus.   August 27, 2009--Presents for an acute office visit. Complains of prod cough with yellow mucus, wheezing, dyspnea, sweats x2-3weeks. Taking otc not helping. Only uses symbicort as needed. Denies chest pain, , orthopnea, hemoptysis, fever, n/v/d, edema, headache.   06/18/2011 Acute OV  Complains of prod cough with light green mucus, wheezing x 2 weeks. OTC not helping.  Cough is keeping him up at night. No hemoptysis or weight loss.  Cough and wheezing are getting worse. No fever. Mucus is mainly clear to white.  Of note was started on ACE inhibtor 1 month ago.   07/17/11- 77 yom former smoker followed for allergic rhinitis, Asthma/ COPD Last saw NP on 06/18/11. Got only temporary help from that Rx w/ prednisone, doxycycline, cough syrup. Still complains of head and chest congestion, blowing and coughing yellow mucus, no  blood fever or pain. Not wheezing. Chest x-ray: 06/18/2011-COPD, no acute process Had lumbar spine surgery 10/20/2010 without respiratory complication.   10/19/2011 Acute OV  Complains of prod cough with green/yellow mucus, increased SOB, wheezing, tightness in chest, sore throat/hoarseness x1 week . Patient has had 3 exacerbations the last 3 months requiring antibiotics, and steroids. We discussed previously that he has on ACE inhibitor that could be exacerbating his cough. Patient also has not been taken his Symbicort on a regular basis. We also discussed medication compliance. Patient denies any hemoptysis, chest pain, orthopnea, PND, or leg swelling. Chest x-ray in December of 2012 showed no acute changes. He's been using some over-the-counter Mucinex without any relief.  03/24/12-77 yom former smoker followed for allergic rhinitis, Asthma/ COPD Stopped up in head x 4-5 days, cough-yellow in color; denies any fever, has slight wheezing. COPD assessment test (CAT) 20/40  ROS-see HPI Constitutional:   No-   weight loss, night sweats, fevers, chills, fatigue, lassitude. HEENT:   No-  headaches, difficulty swallowing, tooth/dental problems, sore throat,       No-  sneezing, itching, ear ache, nasal congestion, post nasal drip,  CV:  No-   chest pain, orthopnea, PND, swelling in lower extremities, anasarca, dizziness, palpitations Resp: No-   shortness of breath with exertion or at rest.              No-   productive cough,  No non-productive cough,  No- coughing up of blood.              No-  change in color of mucus.  No- wheezing.   Skin: No-   rash or lesions. GI:  No-   heartburn, indigestion, abdominal pain, nausea, vomiting, GU:  MS:  No-   joint pain or swelling.  Neuro-     nothing unusual Psych:  No- change in mood or affect. No depression or anxiety.  No memory loss.   Objective:   OBJ- Physical Exam General- Alert, Oriented, Affect-appropriate, Distress- none acute Skin-  rash-none, lesions- none, excoriation- none Lymphadenopathy- none Head- atraumatic            Eyes- Gross vision intact, PERRLA, conjunctivae and secretions clear            Ears- Hearing, canals-normal            Nose- + turbinate edema, no-Septal dev, mucus, polyps, erosion, perforation             Throat- Mallampati II , mucosa + red , drainage- none, tonsils- atrophic Neck- flexible , trachea midline, no stridor , thyroid nl, carotid no bruit Chest - symmetrical excursion , unlabored           Heart/CV- RRR , no murmur , no gallop  , no rub, nl s1 s2                           - JVD- none , edema- none, stasis changes- none, varices- none           Lung- + diminished, wheeze- none, cough+ dry , dullness-none, rub- none           Chest wall-  Abd-  Br/ Gen/ Rectal- Not done, not indicated Extrem- cyanosis- none, clubbing, none, atrophy- none, strength- nl Neuro- grossly intact to observation

## 2012-03-24 NOTE — Patient Instructions (Addendum)
Neb neo nasal  Depo 80  Continue your usual meds  Nasal saline and comfort meds are fine.   Scripts to hold for prednisone taper and for Zpak antibiotic, to take if needed over the next several days

## 2012-04-01 DIAGNOSIS — J069 Acute upper respiratory infection, unspecified: Secondary | ICD-10-CM | POA: Insufficient documentation

## 2012-04-01 NOTE — Assessment & Plan Note (Addendum)
Not yet significantly exacerbating his COPD although there is mild cough now. Plan  nasal decongestant inhalation, Depo-Medrol, prescriptions to hold for Z-Pak and prednisone. Supportive care and fluids.

## 2012-05-05 ENCOUNTER — Encounter: Payer: Self-pay | Admitting: Internal Medicine

## 2012-05-05 ENCOUNTER — Ambulatory Visit (INDEPENDENT_AMBULATORY_CARE_PROVIDER_SITE_OTHER): Payer: Medicare Other | Admitting: Internal Medicine

## 2012-05-05 ENCOUNTER — Ambulatory Visit (INDEPENDENT_AMBULATORY_CARE_PROVIDER_SITE_OTHER)
Admission: RE | Admit: 2012-05-05 | Discharge: 2012-05-05 | Disposition: A | Discharge status: Home / Self Care | Payer: Medicare Other | Source: Ambulatory Visit | Attending: Internal Medicine | Admitting: Internal Medicine

## 2012-05-05 VITALS — BP 120/78 | HR 66 | Ht 70.5 in | Wt 221.4 lb

## 2012-05-05 DIAGNOSIS — J40 Bronchitis, not specified as acute or chronic: Secondary | ICD-10-CM

## 2012-05-05 DIAGNOSIS — J449 Chronic obstructive pulmonary disease, unspecified: Secondary | ICD-10-CM

## 2012-05-05 DIAGNOSIS — J309 Allergic rhinitis, unspecified: Secondary | ICD-10-CM

## 2012-05-05 MED ORDER — AMOXICILLIN-POT CLAVULANATE 875-125 MG PO TABS: 1.0000 | ORAL_TABLET | Freq: Two times a day (BID) | ORAL | Status: DC

## 2012-05-05 MED ORDER — BENZONATATE 200 MG PO CAPS
200.0000 mg | ORAL_CAPSULE | Freq: Three times a day (TID) | ORAL | Status: DC | PRN
Start: 2012-05-05 — End: 2012-05-26

## 2012-05-05 NOTE — Patient Instructions (Addendum)
Scripts sent for augmentin antibiotic and for the Tessalon/ benzonatate perles to try for cough  Suggest Dr Manus Gunning explore the possibility that the losartan is aggravating your cough, by changing perhaps for 1 month trial to a calcium channel blocker like Norvasc.  Order- CXR   Dx bronchitis  Sample Dymista nasal spray- try 1 spray each nostril twice daily

## 2012-05-05 NOTE — Progress Notes (Signed)
Patient ID: Robert Doyle, male    DOB: [birth year], 78 y.o.   MRN: [id number]  HPI 78 yo with known hx of COPD /asthma   09/09/07-  One year follow-up for his respiratory problems. has done well since he saw Tammy for acute bronchitis in October. Otherwise this has been a good year. He was stabel on meds, but now [facility] wants to change off Symbicort. we discussed meds and options. Previously tried Advair. Uses albuterol inhaler before he goes to the gym 3-4 days/ week. No routine cough, phlegm or sneeze. denies chest pain or palpitation.   07/05/08- COPD/asthma, allergic rhinitis  has done ok. Recenet bronchitis. Now 3-4 days, low grade fever, cough yellow sputum, disturbing sleep. Denies myalgias, blood, sore throat. Had been using Symbicort as needed, not needed during summer. C/O cost of Proair. Prednisone helps. Had seasonal flu vax. Discussed H1N1.   March 21, 2009 --Presents for nasal allergies - worries about chest becoming congested. Complains of 3 days nasal congesiton, drippy nose, post nasal drainage. Using mucinex with some help. Coughing up white mucus.   August 27, 2009--Presents for an acute office visit. Complains of prod cough with yellow mucus, wheezing, dyspnea, sweats x2-3weeks. Taking otc not helping. Only uses symbicort as needed. Denies chest pain, , orthopnea, hemoptysis, fever, n/v/d, edema, headache.   06/18/2011 Acute OV  Complains of prod cough with light green mucus, wheezing x 2 weeks. OTC not helping.  Cough is keeping him up at night. No hemoptysis or weight loss.  Cough and wheezing are getting worse. No fever. Mucus is mainly clear to white.  Of note was started on ACE inhibtor 1 month ago.   07/17/11- 78 yom former smoker followed for allergic rhinitis, Asthma/ COPD Last saw NP on 06/18/11. Got only temporary help from that Rx w/ prednisone, doxycycline, cough syrup. Still complains of head and chest congestion, blowing and coughing yellow mucus, no  blood fever or pain. Not wheezing. Chest x-ray: 06/18/2011-COPD, no acute process Had lumbar spine surgery 10/20/2010 without respiratory complication.   10/19/2011 Acute OV  Complains of prod cough with green/yellow mucus, increased SOB, wheezing, tightness in chest, sore throat/hoarseness x1 week . Patient has had 3 exacerbations the last 3 months requiring antibiotics, and steroids. We discussed previously that he has on ACE inhibitor that could be exacerbating his cough. Patient also has not been taken his Symbicort on a regular basis. We also discussed medication compliance. Patient denies any hemoptysis, chest pain, orthopnea, PND, or leg swelling. Chest x-ray in December of 2012 showed no acute changes. He's been using some over-the-counter Mucinex without any relief.  03/24/12-78 yom former smoker followed for allergic rhinitis, Asthma/ COPD Stopped up in head x 4-5 days, cough-yellow in color; denies any fever, has slight wheezing. COPD assessment test (CAT) 20/40  05/05/12- 78 yom former smoker followed for allergic rhinitis, Asthma/ COPD Acute visit: Increase in cough-productive-starts out green then goes to white, wheezing. Not alot of SOB. Not sleeping well from the cough. Treatment at last visit had seemed to help but cough lingered and became productive again. Lisinopril was changed to losartan. Watery rhinorrhea in the morning. COPD assessment test (CABG) score 22/40.   ROS-see HPI Constitutional:   No-   weight loss, night sweats, fevers, chills, fatigue, lassitude. HEENT:   No-  headaches, difficulty swallowing, tooth/dental problems, sore throat,       No-  sneezing, itching, ear ache, nasal congestion, +post nasal drip,  CV:  No-  chest pain, orthopnea, PND, swelling in lower extremities, anasarca, dizziness, palpitations Resp: No-   shortness of breath with exertion or at rest.              +  productive cough,  No non-productive cough,  No- coughing up of blood.               + change in color of mucus.  No- wheezing.   Skin: No-   rash or lesions. GI:  No-   heartburn, indigestion, abdominal pain, nausea, vomiting, GU:  MS:  No-   joint pain or swelling.  Neuro-     nothing unusual Psych:  No- change in mood or affect. No depression or anxiety.  No memory loss.   Objective:   OBJ- Physical Exam General- Alert, Oriented, Affect-appropriate, Distress- none acute Skin- rash-none, lesions- none, excoriation- none Lymphadenopathy- none Head- atraumatic            Eyes- Gross vision intact, PERRLA, conjunctivae and secretions clear            Ears- Hearing, canals-normal            Nose- clear, no-Septal dev, mucus, polyps, erosion, perforation             Throat- Mallampati II , mucosa + red , drainage- none, tonsils- atrophic Neck- flexible , trachea midline, no stridor , thyroid nl, carotid no bruit Chest - symmetrical excursion , unlabored           Heart/CV- RRR , no murmur , no gallop  , no rub, nl s1 s2                           - JVD- none , edema- none, stasis changes- none, varices- none           Lung- + diminished, wheeze- none, cough+ , dullness-none, rub- none           Chest wall-  Abd-  Br/ Gen/ Rectal- Not done, not indicated Extrem- cyanosis- none, clubbing, none, atrophy- none, strength- nl Neuro- grossly intact to observation

## 2012-05-10 NOTE — Progress Notes (Signed)
Quick Note:  LMTCB ______ 

## 2012-05-11 ENCOUNTER — Telehealth: Payer: Self-pay | Admitting: Internal Medicine

## 2012-05-11 NOTE — Telephone Encounter (Signed)
Pt aware of results 

## 2012-05-11 NOTE — Progress Notes (Signed)
Quick Note:  Pt aware of results. ______ 

## 2012-05-15 NOTE — Assessment & Plan Note (Signed)
Persistent bronchitic exacerbation. Losartan can be a cause of cough, but not as often as lisinopril. It may be worth trying to change to a calcium channel blocker for a one-month trial. Plan-Augmentin, benzonatate, chest x-ray

## 2012-05-15 NOTE — Assessment & Plan Note (Signed)
More likely vasomotor rhinitis now. Plan-try a sample of Dymista but if no better then we will Rx ipratropium

## 2012-05-25 ENCOUNTER — Telehealth: Payer: Self-pay | Admitting: Internal Medicine

## 2012-05-25 NOTE — Telephone Encounter (Signed)
LMTCB-can offer Thursday 05-26-12 at 4:15pm with CY.

## 2012-05-25 NOTE — Telephone Encounter (Signed)
Pt returned call. He will take this appt. Robert Doyle

## 2012-05-26 ENCOUNTER — Ambulatory Visit (INDEPENDENT_AMBULATORY_CARE_PROVIDER_SITE_OTHER): Payer: Medicare Other | Admitting: Internal Medicine

## 2012-05-26 ENCOUNTER — Encounter: Payer: Self-pay | Admitting: Internal Medicine

## 2012-05-26 VITALS — BP 110/78 | HR 61 | Ht 70.5 in | Wt 220.2 lb

## 2012-05-26 DIAGNOSIS — J449 Chronic obstructive pulmonary disease, unspecified: Secondary | ICD-10-CM

## 2012-05-26 DIAGNOSIS — J309 Allergic rhinitis, unspecified: Secondary | ICD-10-CM

## 2012-05-26 MED ORDER — PROMETHAZINE-CODEINE 6.25-10 MG/5ML PO SYRP: 5.0000 mL | ORAL_SOLUTION | Freq: Four times a day (QID) | ORAL | Status: DC | PRN

## 2012-05-26 NOTE — Patient Instructions (Addendum)
Instead of Symbicort- use Breo     1 inhalation then rinse mouth, 1 puff, once every day.   This depends on being used every day to be effective  Script for cough syrup  Try to minimize reflux by elevating the head end of your bed, putting a brick under each of the head legs.

## 2012-05-26 NOTE — Progress Notes (Signed)
Patient ID: Robert Doyle, male    DOB: 1933/08/13, 78 y.o.   MRN: [id number]  HPI 78 yo with known hx of COPD /asthma   09/09/07-  One year follow-up for his respiratory problems. has done well since he saw Tammy for acute bronchitis in October. Otherwise this has been a good year. He was stabel on meds, but now [facility] wants to change off Symbicort. we discussed meds and options. Previously tried Advair. Uses albuterol inhaler before he goes to the gym 3-4 days/ week. No routine cough, phlegm or sneeze. denies chest pain or palpitation.   07/05/08- COPD/asthma, allergic rhinitis  has done ok. Recenet bronchitis. Now 3-4 days, low grade fever, cough yellow sputum, disturbing sleep. Denies myalgias, blood, sore throat. Had been using Symbicort as needed, not needed during summer. C/O cost of Proair. Prednisone helps. Had seasonal flu vax. Discussed H1N1.   March 21, 2009 --Presents for nasal allergies - worries about chest becoming congested. Complains of 3 days nasal congesiton, drippy nose, post nasal drainage. Using mucinex with some help. Coughing up white mucus.   August 27, 2009--Presents for an acute office visit. Complains of prod cough with yellow mucus, wheezing, dyspnea, sweats x2-3weeks. Taking otc not helping. Only uses symbicort as needed. Denies chest pain, , orthopnea, hemoptysis, fever, n/v/d, edema, headache.   06/18/2011 Acute OV  Complains of prod cough with light green mucus, wheezing x 2 weeks. OTC not helping.  Cough is keeping him up at night. No hemoptysis or weight loss.  Cough and wheezing are getting worse. No fever. Mucus is mainly clear to white.  Of note was started on ACE inhibtor 1 month ago.   07/17/11- 78 yom former smoker followed for allergic rhinitis, Asthma/ COPD Last saw NP on 06/18/11. Got only temporary help from that Rx w/ prednisone, doxycycline, cough syrup. Still complains of head and chest congestion, blowing and coughing yellow mucus, no  blood fever or pain. Not wheezing. Chest x-ray: 06/18/2011-COPD, no acute process Had lumbar spine surgery 10/20/2010 without respiratory complication.   10/19/2011 Acute OV  Complains of prod cough with green/yellow mucus, increased SOB, wheezing, tightness in chest, sore throat/hoarseness x1 week . Patient has had 3 exacerbations the last 3 months requiring antibiotics, and steroids. We discussed previously that he has on ACE inhibitor that could be exacerbating his cough. Patient also has not been taken his Symbicort on a regular basis. We also discussed medication compliance. Patient denies any hemoptysis, chest pain, orthopnea, PND, or leg swelling. Chest x-ray in December of 2012 showed no acute changes. He's been using some over-the-counter Mucinex without any relief.  03/24/12-78 yom former smoker followed for allergic rhinitis, Asthma/ COPD Stopped up in head x 4-5 days, cough-yellow in color; denies any fever, has slight wheezing. COPD assessment test (CAT) 20/40  05/05/12- 78 yom former smoker followed for allergic rhinitis, Asthma/ COPD Acute visit: Increase in cough-productive-starts out green then goes to white, wheezing. Not alot of SOB. Not sleeping well from the cough. Treatment at last visit had seemed to help but cough lingered and became productive again. Lisinopril was changed to losartan. Watery rhinorrhea in the morning. COPD assessment test (CABG) score 22/40.  05/26/12-  78 yom former smoker followed for allergic rhinitis, Asthma/ COPD FOLLOWS FOR: cough at night keeping patient awake-only sleeps about 3-4 hours at a time. Has not been able to see if Losartan is cause of cough. Cough starts as he lies down. Clear mucus, some wheeze. Not using  Symbicort much "made it worse" but likes his rescue inhaler. History of reflux in the past. Nasal congestion has been much less of a problem and he has used Dymista spray only a few times. CXR 05/11/12 reviewed IMPRESSION: No active  disease. Stable COPD.  Original Report Authenticated By: Natasha Mead, M.D.   ROS-see HPI Constitutional:   No-   weight loss, night sweats, fevers, chills, fatigue, lassitude. HEENT:   No-  headaches, difficulty swallowing, tooth/dental problems, sore throat,       No-  sneezing, itching, ear ache, nasal congestion, +post nasal drip,  CV:  No-   chest pain, orthopnea, PND, swelling in lower extremities, anasarca, dizziness, palpitations Resp: No-   shortness of breath with exertion or at rest.              +  productive cough,  No non-productive cough,  No- coughing up of blood.              No- change in color of mucus.  No- wheezing.   Skin: No-   rash or lesions. GI:  No-   heartburn, indigestion, abdominal pain, nausea, vomiting, GU:  MS:  No-   joint pain or swelling.  Neuro-     nothing unusual Psych:  No- change in mood or affect. No depression or anxiety.  No memory loss.   Objective:   OBJ- Physical Exam General- Alert, Oriented, Affect-appropriate, Distress- none acute Skin- rash-none, lesions- none, excoriation- none Lymphadenopathy- none Head- atraumatic            Eyes- Gross vision intact, PERRLA, conjunctivae and secretions clear            Ears- Hearing, canals-normal            Nose- clear, no-Septal dev, mucus, polyps, erosion, perforation             Throat- Mallampati II , mucosa + red , drainage- none, tonsils- atrophic Neck- flexible , trachea midline, no stridor , thyroid nl, carotid no bruit Chest - symmetrical excursion , unlabored           Heart/CV- RRR , no murmur , no gallop  , no rub, nl s1 s2                           - JVD- none , edema- none, stasis changes- none, varices- none           Lung- + diminished, wheeze- none, dry cough with deep breath + , dullness-none, rub- none           Chest wall-  Abd-  Br/ Gen/ Rectal- Not done, not indicated Extrem- cyanosis- none, clubbing, none, atrophy- none, strength- nl Neuro- grossly intact to  observation

## 2012-05-27 MED ORDER — FLUTICASONE FUROATE-VILANTEROL 100-25 MCG/INH IN AEPB: 1.0000 | INHALATION_SPRAY | Freq: Every day | RESPIRATORY_TRACT | Status: DC

## 2012-06-04 NOTE — Assessment & Plan Note (Signed)
Better control. He has Dymista if needed.

## 2012-06-04 NOTE — Assessment & Plan Note (Signed)
Question of cough now is a bronchitis versus reflux. He thinks Symbicort makes him worse so that is stopped. Plan-sample Breo Ellipta for trial

## 2012-06-06 ENCOUNTER — Telehealth: Payer: Self-pay | Admitting: Internal Medicine

## 2012-06-06 MED ORDER — FLUTICASONE FUROATE-VILANTEROL 100-25 MCG/INH IN AEPB: 1.0000 | INHALATION_SPRAY | Freq: Every day | RESPIRATORY_TRACT | Status: DC

## 2012-06-06 NOTE — Telephone Encounter (Signed)
Ok for him to pick up another sample of Breo Ellipta    1 puff then rise mouth, once daily

## 2012-06-06 NOTE — Telephone Encounter (Signed)
I spoke with pt and he is requesting another sample of the breo. He can't tell just yet if it helping yet or not. Last OV 05/26/12. Pending OV 07/28/12. Please advise Dr. Maple Hudson thanks

## 2012-06-06 NOTE — Telephone Encounter (Signed)
LMTCB X1 W/ SPOUSE--LEFT SAMPLE UPFRONT FOR P/U

## 2012-06-07 ENCOUNTER — Telehealth: Payer: Self-pay | Admitting: Internal Medicine

## 2012-06-07 NOTE — Telephone Encounter (Signed)
Advised pt sample of Breo Ellipta is waiting up front for him to pick up.  Pt verbalized understanding & stated nothing further needed at this time.  Robert Doyle

## 2012-06-07 NOTE — Telephone Encounter (Signed)
Returning call.

## 2012-06-07 NOTE — Telephone Encounter (Signed)
LMTCB- according to [employer] website the patient has insurance coverage so therefore no pt assistance can be given. The patient can call and speak directly with a [employer] rep and see if they can qualify him another way.

## 2012-06-07 NOTE — Telephone Encounter (Signed)
Spoke with patient and gave number to [employer] [phone].Marland KitchenMarland KitchenMarland Kitchenpatient aware to call and speak to rep to see if there is another way to get qualified.

## 2012-06-20 ENCOUNTER — Telehealth: Payer: Self-pay | Admitting: Internal Medicine

## 2012-06-20 MED ORDER — FLUTICASONE FUROATE-VILANTEROL 100-25 MCG/INH IN AEPB: 1.0000 | INHALATION_SPRAY | Freq: Every day | RESPIRATORY_TRACT | Status: DC

## 2012-06-20 NOTE — Telephone Encounter (Signed)
Spoke with patient.  He reports breathing is much better using the Breo.  Would like a Rx sent it for this, Dr. Maple Hudson please advise if this is ok.  Thank You  [facility] [address] Last OV:05/26/12 Next OV:07/29/11 No Known Allergies

## 2012-06-20 NOTE — Telephone Encounter (Signed)
Per CY-okay to give Breo #1 1 puff once daily and RINSE mouth well after use with prn refills.

## 2012-06-20 NOTE — Telephone Encounter (Signed)
Spoke with patient, he is aware the Rx sent to verified pharmacy. Breo #1 PRN refills 1 puff daily.  Patient verbalized understanding and nothing further needed at this time.

## 2012-07-28 ENCOUNTER — Ambulatory Visit: Payer: Medicare Other | Admitting: Internal Medicine

## 2012-08-23 ENCOUNTER — Other Ambulatory Visit: Payer: Self-pay | Admitting: Internal Medicine

## 2012-10-03 ENCOUNTER — Telehealth: Payer: Self-pay | Admitting: Internal Medicine

## 2012-10-03 MED ORDER — AMOXICILLIN-POT CLAVULANATE 875-125 MG PO TABS: 1.0000 | ORAL_TABLET | Freq: Two times a day (BID) | ORAL | Status: AC

## 2012-10-03 NOTE — Telephone Encounter (Signed)
I spoke with pt. He c/o cont cough w/ green phlem, wheezing, chest tx, breathing is doing okay. He is taking mucinex DM. He has not slept in 3 days. Pt is requesting to be seen but nothing available and TP is not in. Please advise Dr. Maple Hudson thanks Last OV 05/08/13 Pending 05/08/13 No Known Allergies

## 2012-10-03 NOTE — Telephone Encounter (Signed)
Per CY-offer Augmentin 875 mg #14 take 1 po bid no refills and take Mucinex OTC.

## 2012-10-03 NOTE — Telephone Encounter (Signed)
i spoke with pt and is aware of CD[facility] recs. rx has been called in. Nothing further was needed

## 2013-05-08 ENCOUNTER — Ambulatory Visit: Payer: Medicare Other | Admitting: Internal Medicine

## 2013-05-23 ENCOUNTER — Emergency Department (HOSPITAL_COMMUNITY)
Admission: EM | Admit: 2013-05-23 | Discharge: 2013-05-23 | Disposition: A | Discharge status: Home / Self Care | Payer: Medicare Other | Attending: Emergency Medicine | Admitting: Emergency Medicine

## 2013-05-23 ENCOUNTER — Encounter (HOSPITAL_COMMUNITY): Payer: Self-pay | Admitting: Emergency Medicine

## 2013-05-23 ENCOUNTER — Emergency Department (HOSPITAL_COMMUNITY): Payer: Medicare Other

## 2013-05-23 DIAGNOSIS — R000 Tachycardia, unspecified: Secondary | ICD-10-CM | POA: Insufficient documentation

## 2013-05-23 DIAGNOSIS — J449 Chronic obstructive pulmonary disease, unspecified: Secondary | ICD-10-CM | POA: Insufficient documentation

## 2013-05-23 DIAGNOSIS — Z79899 Other long term (current) drug therapy: Secondary | ICD-10-CM | POA: Insufficient documentation

## 2013-05-23 DIAGNOSIS — R002 Palpitations: Secondary | ICD-10-CM | POA: Insufficient documentation

## 2013-05-23 DIAGNOSIS — Z87891 Personal history of nicotine dependence: Secondary | ICD-10-CM | POA: Insufficient documentation

## 2013-05-23 DIAGNOSIS — J4489 Other specified chronic obstructive pulmonary disease: Secondary | ICD-10-CM | POA: Insufficient documentation

## 2013-05-23 LAB — POCT I-STAT TROPONIN I: Troponin i, poc: 0.01 ng/mL (ref 0.00–0.08)

## 2013-05-23 LAB — BASIC METABOLIC PANEL
BUN: 19 mg/dL (ref 6–23)
CO2: 27 mEq/L (ref 19–32)
Calcium: 9.2 mg/dL (ref 8.4–10.5)
Chloride: 104 mEq/L (ref 96–112)
GFR calc Af Amer: 57 mL/min — ABNORMAL LOW (ref >90)
Sodium: 140 mEq/L (ref 135–145)

## 2013-05-23 LAB — PRO B NATRIURETIC PEPTIDE: Pro B Natriuretic peptide (BNP): 12 pg/mL (ref 0–450)

## 2013-05-23 LAB — CBC
Hemoglobin: 15.3 g/dL (ref 13.0–17.0)
Platelets: 185 10*3/uL (ref 150–400)
RBC: 5.04 MIL/uL (ref 4.22–5.81)
RDW: 13.8 % (ref 11.5–15.5)
WBC: 13.1 10*3/uL — ABNORMAL HIGH (ref 4.0–10.5)

## 2013-05-23 NOTE — ED Notes (Addendum)
Ambulated pt in the hallway no complaint of headache, shortness of breath, or lightheadedness upon returning to the room heart rate remained at 78

## 2013-05-23 NOTE — ED Provider Notes (Signed)
CSN: [id number]     Arrival date & time 05/23/13  1843 History   First MD Initiated Contact with Patient 05/23/13 1915     Chief Complaint  Patient presents with  . Tachycardia  . Palpitations   (Consider location/radiation/quality/duration/timing/severity/associated sxs/prior Treatment) The history is provided by the patient. No language interpreter was used.  Patient is a 79 year old Caucasian male with past medical history of COPD who comes emergency department today with palpitations. He was eating dinner with his wife when he noticed that his heart rate was fast. He took his pulse it was elevated to 130. As a result he came to the emergency department. He is not having shortness of breath, chest pain, or abdominal pain. He states the palpitations persisted until he walked into the emergency department door at which point the palpitations resolved. He says that he is ready to go home and has no complaints this time. He has no cardiac history. His never had an MI. He denies fever, chills, cough, or pain.   Past Medical History  Diagnosis Date  . COPD (chronic obstructive pulmonary disease)   . Asthma   . Allergic rhinitis    Past Surgical History  Procedure Laterality Date  . Repair deviated septum    . Lumbar spine surgery     Family History  Problem Relation Age of Onset  . Prostate cancer Father    History  Substance Use Topics  . Smoking status: Former Games developer  . Smokeless tobacco: Not on file  . Alcohol Use: Not on file    Review of Systems  Constitutional: Negative for fever and chills.  Respiratory: Negative for cough and shortness of breath.   Cardiovascular: Positive for palpitations. Negative for chest pain.  Gastrointestinal: Negative for nausea, vomiting, abdominal pain and diarrhea.  Genitourinary: Negative for dysuria, urgency and frequency.  Musculoskeletal: Negative for back pain.  Skin: Negative for wound.  Psychiatric/Behavioral: Negative for confusion.   All other systems reviewed and are negative.    Allergies  Review of patient's allergies indicates no known allergies.  Home Medications   Current Outpatient Rx  Name  Route  Sig  Dispense  Refill  . losartan (COZAAR) 50 MG tablet   Oral   Take 1 tablet by mouth daily.         Marland Kitchen lovastatin (MEVACOR) 40 MG tablet   Oral   Take 1 tablet by mouth at bedtime.           BP 144/67  Pulse 130  Temp(Src) 97.9 F (36.6 C) (Oral)  Resp 18  SpO2 96% Physical Exam  Nursing note and vitals reviewed. Constitutional: He is oriented to person, place, and time. He appears well-developed and well-nourished. No distress.  HENT:  Head: Normocephalic and atraumatic.  Eyes: Pupils are equal, round, and reactive to light.  Neck: Normal range of motion.  Cardiovascular: Normal rate, regular rhythm and normal heart sounds.   Pulmonary/Chest: Effort normal and breath sounds normal. No respiratory distress. He has no decreased breath sounds. He has no wheezes. He has no rhonchi. He has no rales.  Abdominal: Soft. He exhibits no distension. There is no tenderness. There is no rebound and no guarding.  Musculoskeletal: He exhibits no edema and no tenderness.  Neurological: He is alert and oriented to person, place, and time. He exhibits normal muscle tone.  Skin: Skin is warm and dry.    ED Course  Procedures (including critical care time) Labs Review Labs Reviewed  CBC -  Abnormal; Notable for the following:    WBC 13.1 (*)    All other components within normal limits  BASIC METABOLIC PANEL - Abnormal; Notable for the following:    Glucose, Bld 102 (*)    GFR calc non Af Amer 49 (*)    GFR calc Af Amer 57 (*)    All other components within normal limits  PRO B NATRIURETIC PEPTIDE  POCT I-STAT TROPONIN I   Imaging Review Dg Chest 2 View  05/23/2013   CLINICAL DATA:  Palpitations.  Tachycardia.  EXAM: CHEST  2 VIEW  COMPARISON:  05/05/2012  FINDINGS: The heart size and mediastinal  contours are within normal limits. Both lungs are clear. Thoracic spine degenerative changes and mid thoracic vertebral body compression deformity are stable.  IMPRESSION: No active cardiopulmonary disease.   Electronically Signed   By: Myles Rosenthal M.D.   On: 05/23/2013 19:31    EKG Interpretation     Ventricular Rate:  132 PR Interval:  144 QRS Duration: 82 QT Interval:  320 QTC Calculation: 474 R Axis:   -81 Text Interpretation:  Sinus tachycardia Pulmonary disease pattern Left anterior fascicular block Cannot rule out Inferior infarct , age undetermined Abnormal ECG Since last tracing Rate faster            MDM  Patient is a 79 year old Caucasian male with past medical history of COPD who comes emergency department today with palpations. Physical exam as above. Initial workup included a troponin, CBC, BMP, BNP, chest x-ray, and EKG. Initial troponin was negative. CBC adenosine 13. BMP was unremarkable.  BNP was 12. Chest x-ray demonstrated no Carpulmonary disease. As result doubt pneumonia or pneumothorax. With no wheezing or shortness of breath COPD exacerbation. With no chest pain or shortness of breath doubt MI or ACS. Initial EKG demonstrated sinus tachycardia with a rate of 130. By the time the patient was admitted in his room his heart rate had decreased to a rate in the 70s. He was observed in the emergency department for approximately an hour as remained 60-70. He was ambulated in the emergency department at that time had no palpitations, no shortness of breath, no chest pain, and his heart rate remained in the 70s. Pt. expressed desire to be discharged. Since he has been asymptomatic since admission he was not felt to require further evaluation at this time. He was instructed to followup with his primary care physician within one to 2 days was provided with a telephone number for cardiology was instructed to followup with them tomorrow for his palpitations. He was instructed to  return to emergency department if he develops chest pain, shortness of breath, or any other concerns. Patient expressed understanding he was discharged in stable condition. Labs imaging review by myself and considered and medical decision-making. Imaging was interpreted radiology. Care was discussed with my attending Dr. Bernette Mayers.   1. Palpitations       Bethann Berkshire, MD 05/24/13 [phone]

## 2013-05-23 NOTE — ED Notes (Signed)
Pt c/o palpitations and tachycardia starting today; pt denies CP or SOB; pt denies hx of same

## 2013-05-24 NOTE — ED Provider Notes (Signed)
I saw and evaluated the patient, reviewed the resident's note and I agree with the findings and plan.  EKG Interpretation     Ventricular Rate:  132 PR Interval:  144 QRS Duration: 82 QT Interval:  320 QTC Calculation: 474 R Axis:   -81 Text Interpretation:  Sinus tachycardia Pulmonary disease pattern Left anterior fascicular block Cannot rule out Inferior infarct , age undetermined Abnormal ECG Since last tracing Rate faster            Pt with transient sinus tachycardia of unknown etiology. No other symptoms of CP, SOB, lightheadedness. Spontaneously slowed to NSR while in the ED without recurrence. Advised close PCP followup.   Charles B. Bernette Mayers, MD 05/24/13 [phone]

## 2013-09-20 ENCOUNTER — Telehealth: Payer: Self-pay | Admitting: Internal Medicine

## 2013-09-20 MED ORDER — AZITHROMYCIN 250 MG PO TABS: ORAL_TABLET | ORAL | Status: DC

## 2013-09-20 NOTE — Telephone Encounter (Signed)
Offer Z pak and suggest Mucinex-DM otc

## 2013-09-20 NOTE — Telephone Encounter (Signed)
Called spoke with pt. C/o constant prod cough w/ green phlem, increase SOB activity/rest, wheezing, chest tx, chest congestion x 1 week. Has been taking mucinex and robitussin OTC. Pt reports he has not slept in 4 days d/t cough. Please advise Dr. Annamaria Boots thanks  No Known Allergies   Current Outpatient Prescriptions on File Prior to Visit  Medication Sig Dispense Refill  . losartan (COZAAR) 50 MG tablet Take 1 tablet by mouth daily.      Marland Kitchen lovastatin (MEVACOR) 40 MG tablet Take 1 tablet by mouth at bedtime.        No current facility-administered medications on file prior to visit.

## 2013-09-20 NOTE — Telephone Encounter (Signed)
Pt aware of recs. rx sent. Nothing further needed

## 2013-09-22 ENCOUNTER — Encounter: Payer: Self-pay | Admitting: Internal Medicine

## 2013-09-22 ENCOUNTER — Ambulatory Visit (INDEPENDENT_AMBULATORY_CARE_PROVIDER_SITE_OTHER): Payer: Commercial Managed Care - HMO | Admitting: Internal Medicine

## 2013-09-22 VITALS — BP 130/78 | HR 71 | Ht 70.5 in | Wt 213.0 lb

## 2013-09-22 DIAGNOSIS — J309 Allergic rhinitis, unspecified: Secondary | ICD-10-CM

## 2013-09-22 DIAGNOSIS — J449 Chronic obstructive pulmonary disease, unspecified: Secondary | ICD-10-CM

## 2013-09-22 DIAGNOSIS — J4489 Other specified chronic obstructive pulmonary disease: Secondary | ICD-10-CM

## 2013-09-22 MED ORDER — FLUTICASONE FUROATE-VILANTEROL 100-25 MCG/INH IN AEPB: 1.0000 | INHALATION_SPRAY | Freq: Every day | RESPIRATORY_TRACT | Status: DC

## 2013-09-22 MED ORDER — ALBUTEROL SULFATE (2.5 MG/3ML) 0.083% IN NEBU: 2.5000 mg | INHALATION_SOLUTION | Freq: Four times a day (QID) | RESPIRATORY_TRACT | Status: DC | PRN

## 2013-09-22 MED ORDER — AMOXICILLIN-POT CLAVULANATE 875-125 MG PO TABS: 1.0000 | ORAL_TABLET | Freq: Two times a day (BID) | ORAL | Status: DC

## 2013-09-22 MED ORDER — METHYLPREDNISOLONE ACETATE 80 MG/ML IJ SUSP
80.0000 mg | Freq: Once | INTRAMUSCULAR | Status: AC
Start: 2013-09-22 — End: 2013-09-22
  Administered 2013-09-22: 80 mg via INTRAMUSCULAR

## 2013-09-22 MED ORDER — COMPRESSOR/NEBULIZER MISC: Status: DC

## 2013-09-22 MED ORDER — LEVALBUTEROL HCL 0.63 MG/3ML IN NEBU
0.6300 mg | INHALATION_SOLUTION | Freq: Once | RESPIRATORY_TRACT | Status: AC
  Administered 2013-09-22: 0.63 mg via RESPIRATORY_TRACT

## 2013-09-22 NOTE — Progress Notes (Signed)
Patient ID: Robert Doyle, male    DOB: 1933/08/19, 80 y.o.   MRN: [id number]  HPI 80 yo with known hx of COPD /asthma   09/09/07-  One year follow-up for his respiratory problems. has done well since he saw Tammy for acute bronchitis in October. Otherwise this has been a good year. He was stabel on meds, but now [facility] wants to change off Symbicort. we discussed meds and options. Previously tried Advair. Uses albuterol inhaler before he goes to the gym 3-4 days/ week. No routine cough, phlegm or sneeze. denies chest pain or palpitation.   07/05/08- COPD/asthma, allergic rhinitis  has done ok. Recenet bronchitis. Now 3-4 days, low grade fever, cough yellow sputum, disturbing sleep. Denies myalgias, blood, sore throat. Had been using Symbicort as needed, not needed during summer. C/O cost of Proair. Prednisone helps. Had seasonal flu vax. Discussed H1N1.   March 21, 2009 --Presents for nasal allergies - worries about chest becoming congested. Complains of 3 days nasal congesiton, drippy nose, post nasal drainage. Using mucinex with some help. Coughing up white mucus.   August 27, 2009--Presents for an acute office visit. Complains of prod cough with yellow mucus, wheezing, dyspnea, sweats x2-3weeks. Taking otc not helping. Only uses symbicort as needed. Denies chest pain, , orthopnea, hemoptysis, fever, n/v/d, edema, headache.   06/18/2011 Acute OV  Complains of prod cough with light green mucus, wheezing x 2 weeks. OTC not helping.  Cough is keeping him up at night. No hemoptysis or weight loss.  Cough and wheezing are getting worse. No fever. Mucus is mainly clear to white.  Of note was started on ACE inhibtor 1 month ago.   07/17/11- 79 yom former smoker followed for allergic rhinitis, Asthma/ COPD Last saw NP on 06/18/11. Got only temporary help from that Rx w/ prednisone, doxycycline, cough syrup. Still complains of head and chest congestion, blowing and coughing yellow mucus, no  blood fever or pain. Not wheezing. Chest x-ray: 06/18/2011-COPD, no acute process Had lumbar spine surgery 10/20/2010 without respiratory complication.   10/19/2011 Acute OV  Complains of prod cough with green/yellow mucus, increased SOB, wheezing, tightness in chest, sore throat/hoarseness x1 week . Patient has had 3 exacerbations the last 3 months requiring antibiotics, and steroids. We discussed previously that he has on ACE inhibitor that could be exacerbating his cough. Patient also has not been taken his Symbicort on a regular basis. We also discussed medication compliance. Patient denies any hemoptysis, chest pain, orthopnea, PND, or leg swelling. Chest x-ray in December of 2012 showed no acute changes. He's been using some over-the-counter Mucinex without any relief.  03/24/12-77 yom former smoker followed for allergic rhinitis, Asthma/ COPD Stopped up in head x 4-5 days, cough-yellow in color; denies any fever, has slight wheezing. COPD assessment test (CAT) 20/40  05/05/12- 69 yom former smoker followed for allergic rhinitis, Asthma/ COPD Acute visit: Increase in cough-productive-starts out green then goes to white, wheezing. Not alot of SOB. Not sleeping well from the cough. Treatment at last visit had seemed to help but cough lingered and became productive again. Lisinopril was changed to losartan. Watery rhinorrhea in the morning. COPD assessment test (CABG) score 22/40.  05/26/12-  40 yom former smoker followed for allergic rhinitis, Asthma/ COPD FOLLOWS FOR: cough at night keeping patient awake-only sleeps about 3-4 hours at a time. Has not been able to see if Losartan is cause of cough. Cough starts as he lies down. Clear mucus, some wheeze. Not using  Symbicort much "made it worse" but likes his rescue inhaler. History of reflux in the past. Nasal congestion has been much less of a problem and he has used Dymista spray only a few times. CXR 05/11/12 reviewed IMPRESSION: No active  disease. Stable COPD.  Original Report Authenticated By: Lahoma Crocker, M.D.   09/22/13- 59 yom former smoker followed for allergic rhinitis, Asthma/ COPD    Wife here FOLLOWS FOR:  Cough with green mucus and increased sob with wheezing x2 weeks Bad cough x3 weeks, chronic but worse. Some shortness of breath. Steam helps. No fever or purulent sputum. Has Symbicort. Finished Z-Pak-not helped. Took some leftover prednisone. Persistent wheeze. CXR 05/23/13 IMPRESSION:  No active cardiopulmonary disease.  Electronically Signed  By: Earle Gell M.D.  On: 05/23/2013 19:31  ROS-see HPI Constitutional:   No-   weight loss, night sweats, fevers, chills, fatigue, lassitude. HEENT:   No-  headaches, difficulty swallowing, tooth/dental problems, sore throat,       No-  sneezing, itching, ear ache, nasal congestion, +post nasal drip,  CV:  No-   chest pain, orthopnea, PND, swelling in lower extremities, anasarca, dizziness, palpitations Resp: No-   shortness of breath with exertion or at rest.              +  productive cough,  No non-productive cough,  No- coughing up of blood.              +change in color of mucus.  + wheezing.   Skin: No-   rash or lesions. GI:  No-   heartburn, indigestion, abdominal pain, nausea, vomiting, GU:  MS:  No-   joint pain or swelling.  Neuro-     nothing unusual Psych:  No- change in mood or affect. No depression or anxiety.  No memory loss.   Objective:   OBJ- Physical Exam General- Alert, Oriented, Affect-appropriate, Distress- none acute Skin- rash-none, lesions- none, excoriation- none Lymphadenopathy- none Head- atraumatic            Eyes- Gross vision intact, PERRLA, conjunctivae and secretions clear            Ears- Hearing, canals-normal            Nose- clear, no-Septal dev, mucus, polyps, erosion, perforation             Throat- Mallampati II , mucosa + red , drainage- none, tonsils- atrophic Neck- flexible , trachea midline, no stridor , thyroid nl,  carotid no bruit Chest - symmetrical excursion , unlabored           Heart/CV- RRR , no murmur , no gallop  , no rub, nl s1 s2                           - JVD- none , edema- none, stasis changes- none, varices- none           Lung- + diminished, wheeze+, dry cough with deep breath + , dullness-none, rub- none           Chest wall-  Abd-  Br/ Gen/ Rectal- Not done, not indicated Extrem- cyanosis- none, clubbing, none, atrophy- none, strength- nl Neuro- grossly intact to observation

## 2013-09-22 NOTE — Patient Instructions (Addendum)
Neb xop 0.63  Depo 80  Sample x 2 Breo Ellipta  1 puff then rinse mouth, twice daily  Script for nebulizer machine and albuterol for a home care company  Augmentin sent to drug store

## 2013-09-26 ENCOUNTER — Telehealth: Payer: Self-pay | Admitting: Internal Medicine

## 2013-09-26 MED ORDER — ALBUTEROL SULFATE (2.5 MG/3ML) 0.083% IN NEBU: 2.5000 mg | INHALATION_SOLUTION | Freq: Four times a day (QID) | RESPIRATORY_TRACT | Status: DC | PRN

## 2013-09-26 NOTE — Telephone Encounter (Signed)
According to referral RX for albuterol was faxed to [facility].   Spoke with pt. He reports he only received the nebulizer but no the medication. I called [facility] and spoke Tarus who shows pt advised them he was going to get this through his [facility]. Called pt back and he does want to get this through his local pharmacy and asked for me to send RX. i have done so. Nothing further needed

## 2013-10-01 ENCOUNTER — Encounter (HOSPITAL_COMMUNITY): Payer: Self-pay | Admitting: Emergency Medicine

## 2013-10-01 ENCOUNTER — Emergency Department (HOSPITAL_COMMUNITY): Payer: Medicare HMO

## 2013-10-01 ENCOUNTER — Emergency Department (HOSPITAL_COMMUNITY)
Admission: EM | Admit: 2013-10-01 | Discharge: 2013-10-01 | Disposition: A | Discharge status: Home / Self Care | Payer: Medicare HMO | Attending: Emergency Medicine | Admitting: Emergency Medicine

## 2013-10-01 DIAGNOSIS — M545 Low back pain, unspecified: Secondary | ICD-10-CM | POA: Insufficient documentation

## 2013-10-01 DIAGNOSIS — J449 Chronic obstructive pulmonary disease, unspecified: Secondary | ICD-10-CM | POA: Insufficient documentation

## 2013-10-01 DIAGNOSIS — J4489 Other specified chronic obstructive pulmonary disease: Secondary | ICD-10-CM | POA: Insufficient documentation

## 2013-10-01 DIAGNOSIS — Z7982 Long term (current) use of aspirin: Secondary | ICD-10-CM | POA: Insufficient documentation

## 2013-10-01 DIAGNOSIS — Z79899 Other long term (current) drug therapy: Secondary | ICD-10-CM | POA: Insufficient documentation

## 2013-10-01 DIAGNOSIS — Z87891 Personal history of nicotine dependence: Secondary | ICD-10-CM | POA: Insufficient documentation

## 2013-10-01 DIAGNOSIS — Z9889 Other specified postprocedural states: Secondary | ICD-10-CM | POA: Insufficient documentation

## 2013-10-01 DIAGNOSIS — M549 Dorsalgia, unspecified: Secondary | ICD-10-CM

## 2013-10-01 MED ORDER — CYCLOBENZAPRINE HCL 5 MG PO TABS: 5.0000 mg | ORAL_TABLET | Freq: Every day | ORAL | Status: DC

## 2013-10-01 MED ORDER — FENTANYL CITRATE 0.05 MG/ML IJ SOLN
100.0000 ug | Freq: Once | INTRAMUSCULAR | Status: AC
  Administered 2013-10-01: 100 ug via NASAL
  Filled 2013-10-01: qty 2

## 2013-10-01 MED ORDER — MORPHINE SULFATE 4 MG/ML IJ SOLN
4.0000 mg | Freq: Once | INTRAMUSCULAR | Status: DC
  Filled 2013-10-01: qty 1

## 2013-10-01 MED ORDER — OXYCODONE-ACETAMINOPHEN 5-325 MG PO TABS
2.0000 | ORAL_TABLET | Freq: Once | ORAL | Status: AC
  Administered 2013-10-01: 2 via ORAL
  Filled 2013-10-01: qty 2

## 2013-10-01 MED ORDER — OXYCODONE-ACETAMINOPHEN 5-325 MG PO TABS: 1.0000 | ORAL_TABLET | ORAL | Status: DC | PRN

## 2013-10-01 NOTE — ED Provider Notes (Signed)
CSN: [id number]     Arrival date & time 10/01/13  1046 History   None    Chief Complaint  Patient presents with  . Back Pain     (Consider location/radiation/quality/duration/timing/severity/associated sxs/prior Treatment) Patient is a 80 y.o. male presenting with back pain. The history is provided by the patient. No language interpreter was used.  Back Pain Associated symptoms: no abdominal pain, no chest pain, no fever, no numbness and no weakness    This is a 80yo WM w/ PMH COPD, asthma and prior lumbar back surgery who presents with c/o lower back pain x 1 week. Patient notes that over the past week he developed some mild low back pain, but last night the pain became worse and this AM while he was taking his albuterol inhaler his back pain became "unbearable" causing him to have difficulty ambulating. Pain is constant and does not radiate anywhere. Denies F/C, saddle paresthesias, changes to bowel/bladder function, leg weakness, hx of cancer. He  Has lost about 20lbs in the last few weeks, but patient has been trying to do this. He does endorse decreased appetite, but attributes this to his asthma symptoms, which he has had trouble with recently. No recent injury to his back or different/unusual activities. He took one Norco 5-325mg , 1 BC powder, and indomethacin 50mg  x 1 today prior to arrival.   Past Medical History  Diagnosis Date  . COPD (chronic obstructive pulmonary disease)   . Asthma   . Allergic rhinitis    Past Surgical History  Procedure Laterality Date  . Repair deviated septum    . Lumbar spine surgery     Family History  Problem Relation Age of Onset  . Prostate cancer Father    History  Substance Use Topics  . Smoking status: Former Research scientist (life sciences)  . Smokeless tobacco: Not on file  . Alcohol Use: No    Review of Systems  Constitutional: Negative for fever and chills.  Respiratory: Negative for cough and shortness of breath.   Cardiovascular: Negative for chest  pain, palpitations and leg swelling.  Gastrointestinal: Negative for nausea, vomiting, abdominal pain and diarrhea.  Genitourinary: Negative for difficulty urinating.  Musculoskeletal: Positive for back pain. Negative for neck pain.  Neurological: Negative for weakness and numbness.  All other systems reviewed and are negative.      Allergies  Review of patient's allergies indicates no known allergies.  Home Medications   Current Outpatient Rx  Name  Route  Sig  Dispense  Refill  . albuterol (PROVENTIL) (2.5 MG/3ML) 0.083% nebulizer solution   Nebulization   Take 2.5 mg by nebulization every 6 (six) hours as needed for wheezing or shortness of breath.         Marland Kitchen aspirin EC 81 MG tablet   Oral   Take 81 mg by mouth daily.         . Fluticasone Furoate-Vilanterol (BREO ELLIPTA) 100-25 MCG/INH AEPB   Inhalation   Inhale 1 puff into the lungs daily.         Marland Kitchen latanoprost (XALATAN) 0.005 % ophthalmic solution   Both Eyes   Place 1 drop into both eyes daily.         Marland Kitchen losartan (COZAAR) 50 MG tablet   Oral   Take 50 mg by mouth daily.          Marland Kitchen lovastatin (MEVACOR) 40 MG tablet   Oral   Take 40 mg by mouth every morning.          Marland Kitchen  Multiple Vitamins-Minerals (MULTIVITAMIN PO)   Oral   Take 1 tablet by mouth daily.         . Omega-3 Fatty Acids (FISH OIL) 1200 MG CAPS   Oral   Take 1,200 mg by mouth daily.          BP 139/76  Pulse 61  Temp(Src) 97.9 F (36.6 C) (Oral)  Resp 22  Ht 5\' 10"  (1.778 m)  Wt 200 lb (90.719 kg)  BMI 28.70 kg/m2  SpO2 100% Physical Exam  Vitals reviewed. Constitutional: He is oriented to person, place, and time. He appears well-developed and well-nourished. No distress.  HENT:  Head: Normocephalic and atraumatic.  Eyes:  Vision grossly intact  Neck: Normal range of motion. Neck supple.  Cardiovascular: Normal rate, regular rhythm and normal heart sounds.   Pulmonary/Chest: Effort normal and breath sounds normal. No  respiratory distress.  Abdominal: Soft. Bowel sounds are normal. There is no tenderness.  Musculoskeletal: He exhibits no edema.  Neurological: He is alert and oriented to person, place, and time. No cranial nerve deficit.  TTP lumbosacral paraspinal muscles, no ttp over spinous processes; no visible overlying skin abnormality other than a well healed surgical scar to central lower back; gait is antalgic with patient in slight forward flexion; AROM limited in all directions, though extension is most affected  Skin: Skin is warm and dry. He is not diaphoretic.    ED Course  Procedures (including critical care time) Labs Review Labs Reviewed - No data to display Imaging Review No results found.   EKG Interpretation None      MDM   This is a 80yo WM with PMH COPD, asthma and prior back surgery who presents with lower back pain. Patient is in significant pain so will give intranasal fentanyl and percocet PO for pain control. Patient has no red flag s/s, though given his prior lumbar fusion, we will plan to obtain lumbar xray.   1:33 PM Patient is feeling much improved. His lumbar xray is negative for acute bony abnormality, hardware is properly positioned. He does have some chronic degenerative changes. Will plan to discharge patient with close follow up with his neurosurgeon and/or PCP if his pain continues. I will given him a prescription for percocet and flexeril. He was instructed to return to the ED if he experiences worsening of his symptoms and especially if he develops leg weakness, fever, chills, difficulty with bowel/bladder.  Rebecca Eaton, MD 10/01/13 1345

## 2013-10-01 NOTE — ED Provider Notes (Signed)
I saw and evaluated the patient, reviewed the resident's note and I agree with the findings and plan.  Gradual onset 1 week low back pain without radiation or associated symptoms examination lumbar tenderness with dorsalis pedis pulses intact bilaterally; normal light touch, denies weakness, EHL and foot dorsiflexion 5/5 bilat   Babette Relic, MD 10/02/13 2113

## 2013-10-01 NOTE — ED Notes (Signed)
Pt c/o low back pain x 1 week, pt reports unable to ambulate. Pt had back surgery 3 years ago in the same area the pain currently is at. Pt denies incontinence of bowel or bladder. Pt denies numbness to legs.

## 2013-10-01 NOTE — ED Notes (Signed)
Pt reports worsening, "unbearable" back pain in his lower right back. Pt reports back surgery "with rods and plastic for a disc" 3 yrs ago. Pt denies radiating pain. Pt reports that standing aggravates the pain and cannot stand up straight. Pt reports that nothing relieves the pain. Pt reports taking some medications for pain this morning and daughter reported those medications to MD. Pt denies abdominal pain, weakness, or any other symptoms. Pt is A&O and in NAD.

## 2013-10-01 NOTE — Discharge Instructions (Signed)
Lumbosacral Strain Lumbosacral strain is a strain of any of the parts that make up your lumbosacral vertebrae. Your lumbosacral vertebrae are the bones that make up the lower third of your backbone. Your lumbosacral vertebrae are held together by muscles and tough, fibrous tissue (ligaments).  CAUSES  A sudden blow to your back can cause lumbosacral strain. Also, anything that causes an excessive stretch of the muscles in the low back can cause this strain. This is typically seen when people exert themselves strenuously, fall, lift heavy objects, bend, or crouch repeatedly. RISK FACTORS  Physically demanding work.  Participation in pushing or pulling sports or sports that require sudden twist of the back (tennis, golf, baseball).  Weight lifting.  Excessive lower back curvature.  Forward-tilted pelvis.  Weak back or abdominal muscles or both.  Tight hamstrings. SIGNS AND SYMPTOMS  Lumbosacral strain may cause pain in the area of your injury or pain that moves (radiates) down your leg.  DIAGNOSIS Your health care provider can often diagnose lumbosacral strain through a physical exam. In some cases, you may need tests such as X-ray exams.  TREATMENT  Treatment for your lower back injury depends on many factors that your clinician will have to evaluate. However, most treatment will include the use of anti-inflammatory medicines. HOME CARE INSTRUCTIONS   Avoid hard physical activities (tennis, racquetball, waterskiing) if you are not in proper physical condition for it. This may aggravate or create problems.  If you have a back problem, avoid sports requiring sudden body movements. Swimming and walking are generally safer activities.  Maintain good posture.  Maintain a healthy weight.  For acute conditions, you may put ice on the injured area.  Put ice in a plastic bag.  Place a towel between your skin and the bag.  Leave the ice on for 20 minutes, 2 3 times a day.  When the  low back starts healing, stretching and strengthening exercises may be recommended. SEEK MEDICAL CARE IF:  Your back pain is getting worse.  You experience severe back pain not relieved with medicines. SEEK IMMEDIATE MEDICAL CARE IF:   You have numbness, tingling, weakness, or problems with the use of your arms or legs.  There is a change in bowel or bladder control.  You have increasing pain in any area of the body, including your belly (abdomen).  You notice shortness of breath, dizziness, or feel faint.  You feel sick to your stomach (nauseous), are throwing up (vomiting), or become sweaty.  You notice discoloration of your toes or legs, or your feet get very cold. MAKE SURE YOU:   Understand these instructions.  Will watch your condition.  Will get help right away if you are not doing well or get worse. Document Released: 04/08/2005 Document Revised: 04/19/2013 Document Reviewed: 02/15/2013 [facility] Patient Information 2014 [place], [place].  Back Exercises Back exercises help treat and prevent back injuries. The goal of back exercises is to increase the strength of your abdominal and back muscles and the flexibility of your back. These exercises should be started when you no longer have back pain. Back exercises include:  Pelvic Tilt. Lie on your back with your knees bent. Tilt your pelvis until the lower part of your back is against the floor. Hold this position 5 to 10 sec and repeat 5 to 10 times.  Knee to Chest. Pull first 1 knee up against your chest and hold for 20 to 30 seconds, repeat this with the other knee, and then both knees.  This may be done with the other leg straight or bent, whichever feels better.  Sit-Ups or Curl-Ups. Bend your knees 90 degrees. Start with tilting your pelvis, and do a partial, slow sit-up, lifting your trunk only 30 to 45 degrees off the floor. Take at least 2 to 3 seconds for each sit-up. Do not do sit-ups with your knees out straight. If  partial sit-ups are difficult, simply do the above but with only tightening your abdominal muscles and holding it as directed.  Hip-Lift. Lie on your back with your knees flexed 90 degrees. Push down with your feet and shoulders as you raise your hips a couple inches off the floor; hold for 10 seconds, repeat 5 to 10 times.  Back arches. Lie on your stomach, propping yourself up on bent elbows. Slowly press on your hands, causing an arch in your low back. Repeat 3 to 5 times. Any initial stiffness and discomfort should lessen with repetition over time.  Shoulder-Lifts. Lie face down with arms beside your body. Keep hips and torso pressed to floor as you slowly lift your head and shoulders off the floor. Do not overdo your exercises, especially in the beginning. Exercises may cause you some mild back discomfort which lasts for a few minutes; however, if the pain is more severe, or lasts for more than 15 minutes, do not continue exercises until you see your caregiver. Improvement with exercise therapy for back problems is slow.  See your caregivers for assistance with developing a proper back exercise program. Document Released: 08/06/2004 Document Revised: 09/21/2011 Document Reviewed: 04/30/2011 [facility] Patient Information 2014 [place].

## 2013-10-08 NOTE — Assessment & Plan Note (Signed)
Acute and chronic bronchitis exacerbation Plan-Augmentin, nebulizer treatment with Xopenex, Depo-Medrol. Description for him to have home nebulizer/albuterol. Try Breo instead of Symbicort

## 2013-10-16 ENCOUNTER — Telehealth: Payer: Self-pay | Admitting: Internal Medicine

## 2013-10-16 MED ORDER — ALBUTEROL SULFATE HFA 108 (90 BASE) MCG/ACT IN AERS: 2.0000 | INHALATION_SPRAY | RESPIRATORY_TRACT | Status: DC | PRN

## 2013-10-16 NOTE — Telephone Encounter (Signed)
Rx has been sent in. Pt is aware. 

## 2013-10-20 ENCOUNTER — Encounter: Payer: Self-pay | Admitting: Internal Medicine

## 2013-10-20 ENCOUNTER — Ambulatory Visit (INDEPENDENT_AMBULATORY_CARE_PROVIDER_SITE_OTHER): Payer: Commercial Managed Care - HMO | Admitting: Internal Medicine

## 2013-10-20 VITALS — BP 120/60 | HR 97 | Ht 70.5 in | Wt 196.4 lb

## 2013-10-20 DIAGNOSIS — J449 Chronic obstructive pulmonary disease, unspecified: Secondary | ICD-10-CM

## 2013-10-20 DIAGNOSIS — J309 Allergic rhinitis, unspecified: Secondary | ICD-10-CM

## 2013-10-20 NOTE — Progress Notes (Signed)
Patient ID: Robert Doyle, male    DOB: 1933/08/13, 80 y.o.   MRN: [id number]  HPI 80 yo with known hx of COPD /asthma   09/09/07-  One year follow-up for his respiratory problems. has done well since he saw Tammy for acute bronchitis in October. Otherwise this has been a good year. He was stabel on meds, but now [facility] wants to change off Symbicort. we discussed meds and options. Previously tried Advair. Uses albuterol inhaler before he goes to the gym 3-4 days/ week. No routine cough, phlegm or sneeze. denies chest pain or palpitation.   07/05/08- COPD/asthma, allergic rhinitis  has done ok. Recenet bronchitis. Now 3-4 days, low grade fever, cough yellow sputum, disturbing sleep. Denies myalgias, blood, sore throat. Had been using Symbicort as needed, not needed during summer. C/O cost of Proair. Prednisone helps. Had seasonal flu vax. Discussed H1N1.   March 21, 2009 --Presents for nasal allergies - worries about chest becoming congested. Complains of 3 days nasal congesiton, drippy nose, post nasal drainage. Using mucinex with some help. Coughing up white mucus.   August 27, 2009--Presents for an acute office visit. Complains of prod cough with yellow mucus, wheezing, dyspnea, sweats x2-3weeks. Taking otc not helping. Only uses symbicort as needed. Denies chest pain, , orthopnea, hemoptysis, fever, n/v/d, edema, headache.   06/18/2011 Acute OV  Complains of prod cough with light green mucus, wheezing x 2 weeks. OTC not helping.  Cough is keeping him up at night. No hemoptysis or weight loss.  Cough and wheezing are getting worse. No fever. Mucus is mainly clear to white.  Of note was started on ACE inhibtor 1 month ago.   07/17/11- 72 yom former smoker followed for allergic rhinitis, Asthma/ COPD Last saw NP on 06/18/11. Got only temporary help from that Rx w/ prednisone, doxycycline, cough syrup. Still complains of head and chest congestion, blowing and coughing yellow mucus, no  blood fever or pain. Not wheezing. Chest x-ray: 06/18/2011-COPD, no acute process Had lumbar spine surgery 10/20/2010 without respiratory complication.   10/19/2011 Acute OV  Complains of prod cough with green/yellow mucus, increased SOB, wheezing, tightness in chest, sore throat/hoarseness x1 week . Patient has had 3 exacerbations the last 3 months requiring antibiotics, and steroids. We discussed previously that he has on ACE inhibitor that could be exacerbating his cough. Patient also has not been taken his Symbicort on a regular basis. We also discussed medication compliance. Patient denies any hemoptysis, chest pain, orthopnea, PND, or leg swelling. Chest x-ray in December of 2012 showed no acute changes. He's been using some over-the-counter Mucinex without any relief.  03/24/12-77 yom former smoker followed for allergic rhinitis, Asthma/ COPD Stopped up in head x 4-5 days, cough-yellow in color; denies any fever, has slight wheezing. COPD assessment test (CAT) 20/40  05/05/12- 67 yom former smoker followed for allergic rhinitis, Asthma/ COPD Acute visit: Increase in cough-productive-starts out green then goes to white, wheezing. Not alot of SOB. Not sleeping well from the cough. Treatment at last visit had seemed to help but cough lingered and became productive again. Lisinopril was changed to losartan. Watery rhinorrhea in the morning. COPD assessment test (CABG) score 22/40.  05/26/12-  23 yom former smoker followed for allergic rhinitis, Asthma/ COPD FOLLOWS FOR: cough at night keeping patient awake-only sleeps about 3-4 hours at a time. Has not been able to see if Losartan is cause of cough. Cough starts as he lies down. Clear mucus, some wheeze. Not using  Symbicort much "made it worse" but likes his rescue inhaler. History of reflux in the past. Nasal congestion has been much less of a problem and he has used Dymista spray only a few times. CXR 05/11/12 reviewed IMPRESSION: No active  disease. Stable COPD.  Original Report Authenticated By: Lahoma Crocker, M.D.   09/22/13- 37 yom former smoker followed for allergic rhinitis, Asthma/ COPD    Wife here FOLLOWS FOR:  Cough with green mucus and increased sob with wheezing x2 weeks Bad cough x3 weeks, chronic but worse. Some shortness of breath. Steam helps. No fever or purulent sputum. Has Symbicort. Finished Z-Pak-not helped. Took some leftover prednisone. Persistent wheeze. CXR 05/23/13 IMPRESSION:  No active cardiopulmonary disease.  Electronically Signed  By: Earle Gell M.D.  On: 05/23/2013 19:31  10/20/13- 57 yom former smoker followed for allergic rhinitis, Asthma/ COPD    Wife here FOLLOWS FOR: continues to have alot mucus-yellow in color since 09-22-13. Increased wheezing.  Denies any PNA or SOB. Cough remains productive. Trying Benadryl. Nebulizer twice daily does help. Did try Augmentin. Breo did not help. We avoid Spiriva because of glaucoma and BPH  ROS-see HPI Constitutional:   No-   weight loss, night sweats, fevers, chills, fatigue, lassitude. HEENT:   No-  headaches, difficulty swallowing, tooth/dental problems, sore throat,       No-  sneezing, itching, ear ache, nasal congestion, +post nasal drip,  CV:  No-   chest pain, orthopnea, PND, swelling in lower extremities, anasarca, dizziness, palpitations Resp: No-   shortness of breath with exertion or at rest.              +  productive cough,  No non-productive cough,  No- coughing up of blood.              +change in color of mucus.  + wheezing.   Skin: No-   rash or lesions. GI:  No-   heartburn, indigestion, abdominal pain, nausea, vomiting, GU:  MS:  No-   joint pain or swelling.  Neuro-     nothing unusual Psych:  No- change in mood or affect. No depression or anxiety.  No memory loss.   Objective:   OBJ- Physical Exam General- Alert, Oriented, Affect-appropriate, Distress- none acute Skin- rash-none, lesions- none, excoriation- none Lymphadenopathy-  none Head- atraumatic            Eyes- Gross vision intact, PERRLA, conjunctivae and secretions clear            Ears- Hearing, canals-normal            Nose- clear, no-Septal dev, mucus, polyps, erosion, perforation             Throat- Mallampati II , mucosa + red , drainage- none, tonsils- atrophic Neck- flexible , trachea midline, no stridor , thyroid nl, carotid no bruit Chest - symmetrical excursion , unlabored           Heart/CV- RRR , no murmur , no gallop  , no rub, nl s1 s2                           - JVD- none , edema- none, stasis changes- none, varices- none           Lung- + diminished, wheeze-none, dry cough with deep breath + , dullness-none, rub-  none           Chest wall-  Abd-  Br/ Gen/ Rectal- Not done, not indicated Extrem- cyanosis- none, clubbing, none, atrophy- none, strength- nl Neuro- grossly intact to observation

## 2013-10-20 NOTE — Patient Instructions (Addendum)
Script sent for prednisone taper and for biaxin antibiotic  You could try otc Allegra/ fexofenadine antihistamine instead of Banophen

## 2013-10-23 ENCOUNTER — Telehealth: Payer: Self-pay | Admitting: Internal Medicine

## 2013-10-23 MED ORDER — PREDNISONE 10 MG PO TABS: ORAL_TABLET | ORAL | Status: DC

## 2013-10-23 MED ORDER — CLARITHROMYCIN 500 MG PO TABS: 500.0000 mg | ORAL_TABLET | Freq: Two times a day (BID) | ORAL | Status: DC

## 2013-10-23 NOTE — Telephone Encounter (Signed)
pER ov 10/20/13: Patient Instructions     Script sent for prednisone taper and for biaxin antibiotic  You could try otc Allegra/ fexofenadine antihistamine instead of Banophen  --  Neither RX's were called in. Please advise directions/quantity for both Dr. Annamaria Boots thanks

## 2013-10-23 NOTE — Telephone Encounter (Signed)
Prednisone 10 mg, # 20, 4 X 2 DAYS, 3 X 2 DAYS, 2 X 2 DAYS, 1 X 2 DAYS  No ref Biaxin 500 mg, # 14, 1 twice daily with meals

## 2013-10-23 NOTE — Telephone Encounter (Signed)
Spoke with pt. Apologized for medications not being sent in. Rx's have been sent in. Nothing further is needed.

## 2013-10-23 NOTE — Telephone Encounter (Signed)
Pt is calling back to check on the status of this & can be reached at [phone] or [phone].  Pt wants to know why meds were not called in last week as discussed.  Robert Doyle

## 2013-11-02 ENCOUNTER — Ambulatory Visit: Payer: Medicare HMO | Admitting: Internal Medicine

## 2013-11-19 NOTE — Assessment & Plan Note (Signed)
Plan-Allegra, watch response to prednisone taper

## 2013-11-19 NOTE — Assessment & Plan Note (Signed)
COPD with chronic bronchitis Plan-prednisone 8- day taper, Biaxin

## 2014-02-26 ENCOUNTER — Other Ambulatory Visit: Payer: Self-pay | Admitting: Gastroenterology

## 2014-02-27 ENCOUNTER — Encounter (HOSPITAL_COMMUNITY): Payer: Self-pay | Admitting: Pharmacy Technician

## 2014-03-01 ENCOUNTER — Encounter (HOSPITAL_COMMUNITY): Payer: Self-pay | Admitting: *Deleted

## 2014-03-12 ENCOUNTER — Ambulatory Visit (HOSPITAL_COMMUNITY): Payer: Medicare HMO | Admitting: Anesthesiology

## 2014-03-12 ENCOUNTER — Encounter (HOSPITAL_COMMUNITY): Payer: Self-pay | Admitting: *Deleted

## 2014-03-12 ENCOUNTER — Ambulatory Visit (HOSPITAL_COMMUNITY)
Admission: RE | Admit: 2014-03-12 | Discharge: 2014-03-12 | Disposition: A | Discharge status: Home / Self Care | Payer: Medicare HMO | Source: Ambulatory Visit | Attending: Gastroenterology | Admitting: Gastroenterology

## 2014-03-12 ENCOUNTER — Encounter (HOSPITAL_COMMUNITY)
Admission: RE | Disposition: A | Discharge status: Home / Self Care | Payer: Self-pay | Source: Ambulatory Visit | Attending: Gastroenterology

## 2014-03-12 ENCOUNTER — Encounter (HOSPITAL_COMMUNITY): Payer: Medicare HMO | Admitting: Anesthesiology

## 2014-03-12 DIAGNOSIS — E7800 Pure hypercholesterolemia, unspecified: Secondary | ICD-10-CM | POA: Insufficient documentation

## 2014-03-12 DIAGNOSIS — N183 Chronic kidney disease, stage 3 unspecified: Secondary | ICD-10-CM | POA: Diagnosis not present

## 2014-03-12 DIAGNOSIS — N400 Enlarged prostate without lower urinary tract symptoms: Secondary | ICD-10-CM | POA: Insufficient documentation

## 2014-03-12 DIAGNOSIS — J45909 Unspecified asthma, uncomplicated: Secondary | ICD-10-CM | POA: Diagnosis not present

## 2014-03-12 DIAGNOSIS — I129 Hypertensive chronic kidney disease with stage 1 through stage 4 chronic kidney disease, or unspecified chronic kidney disease: Secondary | ICD-10-CM | POA: Insufficient documentation

## 2014-03-12 DIAGNOSIS — Z1211 Encounter for screening for malignant neoplasm of colon: Secondary | ICD-10-CM | POA: Insufficient documentation

## 2014-03-12 HISTORY — DX: Essential (primary) hypertension: I10

## 2014-03-12 HISTORY — PX: COLONOSCOPY WITH PROPOFOL: SHX5780

## 2014-03-12 SURGERY — COLONOSCOPY WITH PROPOFOL
Anesthesia: Monitor Anesthesia Care

## 2014-03-12 MED ORDER — LACTATED RINGERS IV SOLN
INTRAVENOUS | Status: DC | PRN
  Administered 2014-03-12: 09:00:00 via INTRAVENOUS

## 2014-03-12 MED ORDER — PROPOFOL 10 MG/ML IV BOLUS
INTRAVENOUS | Status: AC
  Filled 2014-03-12: qty 20

## 2014-03-12 MED ORDER — LACTATED RINGERS IV SOLN: INTRAVENOUS | Status: DC

## 2014-03-12 MED ORDER — SODIUM CHLORIDE 0.9 % IV SOLN: INTRAVENOUS | Status: DC

## 2014-03-12 MED ORDER — PROPOFOL INFUSION 10 MG/ML OPTIME
INTRAVENOUS | Status: DC | PRN
  Administered 2014-03-12: 80 ug/kg/min via INTRAVENOUS

## 2014-03-12 SURGICAL SUPPLY — 22 items

## 2014-03-12 NOTE — Discharge Instructions (Signed)
Colonoscopy, Care After °These instructions give you information on caring for yourself after your procedure. Your doctor may also give you more specific instructions. Call your doctor if you have any problems or questions after your procedure. °HOME CARE °· Do not drive for 24 hours. °· Do not sign important papers or use machinery for 24 hours. °· You may shower. °· You may go back to your usual activities, but go slower for the first 24 hours. °· Take rest breaks often during the first 24 hours. °· Walk around or use warm packs on your belly (abdomen) if you have belly cramping or gas. °· Drink enough fluids to keep your pee (urine) clear or pale yellow. °· Resume your normal diet. Avoid heavy or fried foods. °· Avoid drinking alcohol for 24 hours or as told by your doctor. °· Only take medicines as told by your doctor. °If a tissue sample (biopsy) was taken during the procedure:  °· Do not take aspirin or blood thinners for 7 days, or as told by your doctor. °· Do not drink alcohol for 7 days, or as told by your doctor. °· Eat soft foods for the first 24 hours. °GET HELP IF: °You still have a small amount of blood in your poop (stool) 2-3 days after the procedure. °GET HELP RIGHT AWAY IF: °· You have more than a small amount of blood in your poop. °· You see clumps of tissue (blood clots) in your poop. °· Your belly is puffy (swollen). °· You feel sick to your stomach (nauseous) or throw up (vomit). °· You have a fever. °· You have belly pain that gets worse and medicine does not help. °MAKE SURE YOU: °· Understand these instructions. °· Will watch your condition. °· Will get help right away if you are not doing well or get worse. °Document Released: 08/01/2010 Document Revised: 07/04/2013 Document Reviewed: 03/06/2013 °[employer]® Patient Information ©2015 [employer]. This information is not intended to replace advice given to you by your health care provider. Make sure you discuss any questions you have with  your health care provider. ° °

## 2014-03-12 NOTE — Transfer of Care (Signed)
Immediate Anesthesia Transfer of Care Note  Patient: Robert Doyle  Procedure(s) Performed: Procedure(s): COLONOSCOPY WITH PROPOFOL (N/A)  Patient Location: PACU  Anesthesia Type:MAC  Level of Consciousness: sedated  Airway & Oxygen Therapy: Patient Spontanous Breathing and Patient connected to nasal cannula oxygen  Post-op Assessment: Report given to PACU RN and Post -op Vital signs reviewed and stable  Post vital signs: Reviewed and stable  Complications: No apparent anesthesia complications

## 2014-03-12 NOTE — Anesthesia Postprocedure Evaluation (Signed)
  Anesthesia Post-op Note  Patient: Robert Doyle  Procedure(s) Performed: Procedure(s): COLONOSCOPY WITH PROPOFOL (N/A)  Patient Location: PACU  Anesthesia Type:MAC  Level of Consciousness: awake, alert  and oriented  Airway and Oxygen Therapy: Patient Spontanous Breathing  Post-op Pain: none  Post-op Assessment: Post-op Vital signs reviewed  Post-op Vital Signs: Reviewed  Last Vitals:  Filed Vitals:   03/12/14 1020  BP: 124/74  Pulse: 54  Temp:   Resp: 11    Complications: No apparent anesthesia complications

## 2014-03-12 NOTE — Anesthesia Preprocedure Evaluation (Addendum)
Anesthesia Evaluation  Patient identified by MRN, date of birth, ID band Patient awake    Reviewed: Allergy & Precautions, H&P , NPO status , Patient's Chart, lab work & pertinent test results  Airway Mallampati: III TM Distance: >3 FB Neck ROM: Full    Dental  (+) Teeth Intact, Dental Advisory Given   Pulmonary asthma , COPD COPD inhaler, former smoker,  breath sounds clear to auscultation        Cardiovascular hypertension, Pt. on medications Rhythm:Regular Rate:Normal     Neuro/Psych negative neurological ROS  negative psych ROS   GI/Hepatic negative GI ROS, Neg liver ROS,   Endo/Other  negative endocrine ROS  Renal/GU negative Renal ROS  negative genitourinary   Musculoskeletal negative musculoskeletal ROS (+)   Abdominal   Peds  Hematology negative hematology ROS (+)   Anesthesia Other Findings   Reproductive/Obstetrics negative OB ROS                          Anesthesia Physical Anesthesia Plan  ASA: II  Anesthesia Plan: MAC   Post-op Pain Management:    Induction: Intravenous  Airway Management Planned: Simple Face Mask  Additional Equipment: None  Intra-op Plan:   Post-operative Plan:   Informed Consent: I have reviewed the patients History and Physical, chart, labs and discussed the procedure including the risks, benefits and alternatives for the proposed anesthesia with the patient or authorized representative who has indicated his/her understanding and acceptance.   Dental advisory given  Plan Discussed with: CRNA and Surgeon  Anesthesia Plan Comments:         Anesthesia Quick Evaluation

## 2014-03-12 NOTE — Op Note (Signed)
Procedure: Screening colonoscopy. Normal screening colonoscopy performed on 04/13/2003.  Endoscopist: Earle Gell  Premedication: Propofol administered by anesthesia  Procedure: The patient was placed in the left lateral decubitus position. Anal inspection and digital rectal exam were normal. The Pentax pediatric colonoscope was introduced into the rectum and advanced to the cecum. A normal-appearing appendiceal orifice was identified. A normal-appearing ileocecal valve was identified. Colonic preparation for the exam today was good. Performance of the colonoscopy was technically difficult due to colonic loop formation in the left upper quadrant and transverse colon.  Rectum. Normal. Retroflexed view of the distal rectum normal  Sigmoid colon and descending colon. Normal  Splenic flexure. Normal  Transverse colon. Normal  Hepatic flexure. Normal  Ascending colon. Normal  Cecum and ileocecal valve. Normal  Assessment: Normal screening colonoscopy

## 2014-03-12 NOTE — H&P (Signed)
  Procedure: Screening colonoscopy. Normal screening colonoscopy performed on 04/13/2003.  History: The patient is a 80 year old male born 03/25/1934. He is scheduled to undergo a repeat screening colonoscopy with polypectomy to prevent colon cancer.  Medication allergies: Lisinopril causes cough  Past medical history: Left shoulder surgery. Lumbar spinal stenosis surgery. Septoplasty. Hypertension. Hypercholesterolemia. Asthma. Benign prostatic hypertrophy. Stage III chronic kidney disease. Peripheral neuropathy. Asthma.  Exam: The patient is alert and lying comfortably on the endoscopy stretcher. Abdomen is soft and nontender to palpation. Lungs are clear to auscultation. Cardiac exam reveals a regular rhythm.  Plan: Proceed with screening colonoscopy

## 2014-03-13 ENCOUNTER — Encounter (HOSPITAL_COMMUNITY): Payer: Self-pay | Admitting: Gastroenterology

## 2014-11-22 ENCOUNTER — Telehealth: Payer: Self-pay | Admitting: Internal Medicine

## 2014-11-22 MED ORDER — AZITHROMYCIN 250 MG PO TABS: ORAL_TABLET | ORAL | Status: AC

## 2014-11-22 NOTE — Telephone Encounter (Signed)
Spoke with pt and advised of Dr Janee Morn recommendations.  Rx sent

## 2014-11-22 NOTE — Telephone Encounter (Signed)
Called and spoke to pt. Pt c/o increase in SOB, prod cough with yellow mucus, rhinorrhea, chest tightness x 3 days. Pt denies f/c/s. Pt stated he has been taking allegra without relief.   CY please advise.   No Known Allergies  Current Outpatient Prescriptions on File Prior to Visit  Medication Sig Dispense Refill  . albuterol (PROAIR HFA) 108 (90 BASE) MCG/ACT inhaler Inhale 2 puffs into the lungs every 4 (four) hours as needed for wheezing or shortness of breath. 1 Inhaler 3  . albuterol (PROVENTIL) (2.5 MG/3ML) 0.083% nebulizer solution Take 2.5 mg by nebulization every 6 (six) hours as needed for wheezing or shortness of breath.    Marland Kitchen aspirin EC 81 MG tablet Take 81 mg by mouth daily.    . Calcium Carb-Cholecalciferol (CALCIUM 600 + D PO) Take 1 tablet by mouth daily.    . Fluticasone Furoate-Vilanterol (BREO ELLIPTA) 100-25 MCG/INH AEPB Inhale 1 puff into the lungs daily.    Marland Kitchen latanoprost (XALATAN) 0.005 % ophthalmic solution Place 1 drop into both eyes daily.    Marland Kitchen losartan (COZAAR) 50 MG tablet Take 50 mg by mouth daily.     Marland Kitchen lovastatin (MEVACOR) 40 MG tablet Take 40 mg by mouth every morning.     . Multiple Vitamins-Minerals (MULTIVITAMIN PO) Take 1 tablet by mouth daily.    . Multiple Vitamins-Minerals (PRESERVISION AREDS PO) Take 1 tablet by mouth 2 (two) times daily.    . Omega-3 Fatty Acids (FISH OIL) 1200 MG CAPS Take 1,200 mg by mouth daily.     No current facility-administered medications on file prior to visit.

## 2014-11-22 NOTE — Telephone Encounter (Signed)
Offer Z pak and suggest Mucinex 

## 2015-04-14 ENCOUNTER — Other Ambulatory Visit: Payer: Self-pay | Admitting: Internal Medicine

## 2015-06-26 ENCOUNTER — Encounter: Payer: Self-pay | Admitting: Cardiovascular Disease

## 2015-07-29 DIAGNOSIS — J449 Chronic obstructive pulmonary disease, unspecified: Secondary | ICD-10-CM | POA: Diagnosis not present

## 2015-08-14 DIAGNOSIS — H353124 Nonexudative age-related macular degeneration, left eye, advanced atrophic with subfoveal involvement: Secondary | ICD-10-CM | POA: Diagnosis not present

## 2015-08-14 DIAGNOSIS — H353113 Nonexudative age-related macular degeneration, right eye, advanced atrophic without subfoveal involvement: Secondary | ICD-10-CM | POA: Diagnosis not present

## 2015-08-14 DIAGNOSIS — H353211 Exudative age-related macular degeneration, right eye, with active choroidal neovascularization: Secondary | ICD-10-CM | POA: Diagnosis not present

## 2015-09-03 DIAGNOSIS — J45909 Unspecified asthma, uncomplicated: Secondary | ICD-10-CM | POA: Diagnosis not present

## 2015-09-03 DIAGNOSIS — J9801 Acute bronchospasm: Secondary | ICD-10-CM | POA: Diagnosis not present

## 2015-09-03 DIAGNOSIS — J40 Bronchitis, not specified as acute or chronic: Secondary | ICD-10-CM | POA: Diagnosis not present

## 2015-10-24 DIAGNOSIS — Z125 Encounter for screening for malignant neoplasm of prostate: Secondary | ICD-10-CM | POA: Diagnosis not present

## 2015-10-24 DIAGNOSIS — I10 Essential (primary) hypertension: Secondary | ICD-10-CM | POA: Diagnosis not present

## 2015-10-24 DIAGNOSIS — N390 Urinary tract infection, site not specified: Secondary | ICD-10-CM | POA: Diagnosis not present

## 2015-10-29 DIAGNOSIS — J449 Chronic obstructive pulmonary disease, unspecified: Secondary | ICD-10-CM | POA: Diagnosis not present

## 2015-10-29 DIAGNOSIS — I10 Essential (primary) hypertension: Secondary | ICD-10-CM | POA: Diagnosis not present

## 2015-10-29 DIAGNOSIS — E785 Hyperlipidemia, unspecified: Secondary | ICD-10-CM | POA: Diagnosis not present

## 2015-10-29 DIAGNOSIS — R972 Elevated prostate specific antigen [PSA]: Secondary | ICD-10-CM | POA: Diagnosis not present

## 2015-10-30 DIAGNOSIS — H43813 Vitreous degeneration, bilateral: Secondary | ICD-10-CM | POA: Diagnosis not present

## 2015-10-30 DIAGNOSIS — H401123 Primary open-angle glaucoma, left eye, severe stage: Secondary | ICD-10-CM | POA: Diagnosis not present

## 2015-10-30 DIAGNOSIS — H2513 Age-related nuclear cataract, bilateral: Secondary | ICD-10-CM | POA: Diagnosis not present

## 2015-10-30 DIAGNOSIS — H353131 Nonexudative age-related macular degeneration, bilateral, early dry stage: Secondary | ICD-10-CM | POA: Diagnosis not present

## 2015-11-04 DIAGNOSIS — H353124 Nonexudative age-related macular degeneration, left eye, advanced atrophic with subfoveal involvement: Secondary | ICD-10-CM | POA: Diagnosis not present

## 2015-11-04 DIAGNOSIS — H353211 Exudative age-related macular degeneration, right eye, with active choroidal neovascularization: Secondary | ICD-10-CM | POA: Diagnosis not present

## 2016-01-28 DIAGNOSIS — H353124 Nonexudative age-related macular degeneration, left eye, advanced atrophic with subfoveal involvement: Secondary | ICD-10-CM | POA: Diagnosis not present

## 2016-01-28 DIAGNOSIS — H353113 Nonexudative age-related macular degeneration, right eye, advanced atrophic without subfoveal involvement: Secondary | ICD-10-CM | POA: Diagnosis not present

## 2016-01-28 DIAGNOSIS — H353211 Exudative age-related macular degeneration, right eye, with active choroidal neovascularization: Secondary | ICD-10-CM | POA: Diagnosis not present

## 2016-01-28 DIAGNOSIS — H2511 Age-related nuclear cataract, right eye: Secondary | ICD-10-CM | POA: Diagnosis not present

## 2016-02-26 DIAGNOSIS — N401 Enlarged prostate with lower urinary tract symptoms: Secondary | ICD-10-CM | POA: Diagnosis not present

## 2016-02-26 DIAGNOSIS — R972 Elevated prostate specific antigen [PSA]: Secondary | ICD-10-CM | POA: Diagnosis not present

## 2016-02-26 DIAGNOSIS — R3911 Hesitancy of micturition: Secondary | ICD-10-CM | POA: Diagnosis not present

## 2016-03-04 DIAGNOSIS — H401132 Primary open-angle glaucoma, bilateral, moderate stage: Secondary | ICD-10-CM | POA: Diagnosis not present

## 2016-03-04 DIAGNOSIS — H43813 Vitreous degeneration, bilateral: Secondary | ICD-10-CM | POA: Diagnosis not present

## 2016-03-04 DIAGNOSIS — H2513 Age-related nuclear cataract, bilateral: Secondary | ICD-10-CM | POA: Diagnosis not present

## 2016-03-09 DIAGNOSIS — H353212 Exudative age-related macular degeneration, right eye, with inactive choroidal neovascularization: Secondary | ICD-10-CM | POA: Diagnosis not present

## 2016-03-09 DIAGNOSIS — H353124 Nonexudative age-related macular degeneration, left eye, advanced atrophic with subfoveal involvement: Secondary | ICD-10-CM | POA: Diagnosis not present

## 2016-03-09 DIAGNOSIS — H353113 Nonexudative age-related macular degeneration, right eye, advanced atrophic without subfoveal involvement: Secondary | ICD-10-CM | POA: Diagnosis not present

## 2016-03-09 DIAGNOSIS — H2511 Age-related nuclear cataract, right eye: Secondary | ICD-10-CM | POA: Diagnosis not present

## 2016-04-22 DIAGNOSIS — I10 Essential (primary) hypertension: Secondary | ICD-10-CM | POA: Diagnosis not present

## 2016-04-29 DIAGNOSIS — Z125 Encounter for screening for malignant neoplasm of prostate: Secondary | ICD-10-CM | POA: Diagnosis not present

## 2016-04-29 DIAGNOSIS — J449 Chronic obstructive pulmonary disease, unspecified: Secondary | ICD-10-CM | POA: Diagnosis not present

## 2016-04-29 DIAGNOSIS — I10 Essential (primary) hypertension: Secondary | ICD-10-CM | POA: Diagnosis not present

## 2016-04-29 DIAGNOSIS — E785 Hyperlipidemia, unspecified: Secondary | ICD-10-CM | POA: Diagnosis not present

## 2016-04-29 DIAGNOSIS — Z0000 Encounter for general adult medical examination without abnormal findings: Secondary | ICD-10-CM | POA: Diagnosis not present

## 2016-04-29 DIAGNOSIS — Z23 Encounter for immunization: Secondary | ICD-10-CM | POA: Diagnosis not present

## 2016-05-18 DIAGNOSIS — J9801 Acute bronchospasm: Secondary | ICD-10-CM | POA: Diagnosis not present

## 2016-05-18 DIAGNOSIS — R5383 Other fatigue: Secondary | ICD-10-CM | POA: Diagnosis not present

## 2016-05-18 DIAGNOSIS — R05 Cough: Secondary | ICD-10-CM | POA: Diagnosis not present

## 2016-05-18 DIAGNOSIS — J209 Acute bronchitis, unspecified: Secondary | ICD-10-CM | POA: Diagnosis not present

## 2016-05-19 ENCOUNTER — Other Ambulatory Visit: Payer: Self-pay | Admitting: Internal Medicine

## 2016-07-09 DIAGNOSIS — H353113 Nonexudative age-related macular degeneration, right eye, advanced atrophic without subfoveal involvement: Secondary | ICD-10-CM | POA: Diagnosis not present

## 2016-07-09 DIAGNOSIS — H353124 Nonexudative age-related macular degeneration, left eye, advanced atrophic with subfoveal involvement: Secondary | ICD-10-CM | POA: Diagnosis not present

## 2016-07-09 DIAGNOSIS — H2511 Age-related nuclear cataract, right eye: Secondary | ICD-10-CM | POA: Diagnosis not present

## 2016-07-09 DIAGNOSIS — H353212 Exudative age-related macular degeneration, right eye, with inactive choroidal neovascularization: Secondary | ICD-10-CM | POA: Diagnosis not present

## 2016-07-20 DIAGNOSIS — H401132 Primary open-angle glaucoma, bilateral, moderate stage: Secondary | ICD-10-CM | POA: Diagnosis not present

## 2016-07-20 DIAGNOSIS — H2513 Age-related nuclear cataract, bilateral: Secondary | ICD-10-CM | POA: Diagnosis not present

## 2016-07-20 DIAGNOSIS — H43813 Vitreous degeneration, bilateral: Secondary | ICD-10-CM | POA: Diagnosis not present

## 2016-08-19 DIAGNOSIS — I10 Essential (primary) hypertension: Secondary | ICD-10-CM | POA: Diagnosis not present

## 2016-08-19 DIAGNOSIS — Z125 Encounter for screening for malignant neoplasm of prostate: Secondary | ICD-10-CM | POA: Diagnosis not present

## 2016-08-26 DIAGNOSIS — N400 Enlarged prostate without lower urinary tract symptoms: Secondary | ICD-10-CM | POA: Diagnosis not present

## 2016-08-26 DIAGNOSIS — Z0000 Encounter for general adult medical examination without abnormal findings: Secondary | ICD-10-CM | POA: Diagnosis not present

## 2016-08-26 DIAGNOSIS — I10 Essential (primary) hypertension: Secondary | ICD-10-CM | POA: Diagnosis not present

## 2016-08-26 DIAGNOSIS — E7800 Pure hypercholesterolemia, unspecified: Secondary | ICD-10-CM | POA: Diagnosis not present

## 2016-10-19 DIAGNOSIS — H353210 Exudative age-related macular degeneration, right eye, stage unspecified: Secondary | ICD-10-CM | POA: Diagnosis not present

## 2016-10-19 DIAGNOSIS — H2513 Age-related nuclear cataract, bilateral: Secondary | ICD-10-CM | POA: Diagnosis not present

## 2016-10-19 DIAGNOSIS — H401132 Primary open-angle glaucoma, bilateral, moderate stage: Secondary | ICD-10-CM | POA: Diagnosis not present

## 2016-10-19 DIAGNOSIS — H353121 Nonexudative age-related macular degeneration, left eye, early dry stage: Secondary | ICD-10-CM | POA: Diagnosis not present

## 2016-10-19 DIAGNOSIS — H43813 Vitreous degeneration, bilateral: Secondary | ICD-10-CM | POA: Diagnosis not present

## 2016-11-03 HISTORY — PX: CATARACT EXTRACTION W/PHACO: SHX586

## 2016-11-05 DIAGNOSIS — H2512 Age-related nuclear cataract, left eye: Secondary | ICD-10-CM | POA: Diagnosis not present

## 2016-11-05 DIAGNOSIS — H401132 Primary open-angle glaucoma, bilateral, moderate stage: Secondary | ICD-10-CM | POA: Diagnosis not present

## 2016-11-05 DIAGNOSIS — H401122 Primary open-angle glaucoma, left eye, moderate stage: Secondary | ICD-10-CM | POA: Diagnosis not present

## 2016-11-17 DIAGNOSIS — H353113 Nonexudative age-related macular degeneration, right eye, advanced atrophic without subfoveal involvement: Secondary | ICD-10-CM | POA: Diagnosis not present

## 2016-11-17 DIAGNOSIS — H2511 Age-related nuclear cataract, right eye: Secondary | ICD-10-CM | POA: Diagnosis not present

## 2016-11-17 DIAGNOSIS — H353212 Exudative age-related macular degeneration, right eye, with inactive choroidal neovascularization: Secondary | ICD-10-CM | POA: Diagnosis not present

## 2016-11-17 DIAGNOSIS — H353124 Nonexudative age-related macular degeneration, left eye, advanced atrophic with subfoveal involvement: Secondary | ICD-10-CM | POA: Diagnosis not present

## 2016-11-22 HISTORY — PX: CATARACT EXTRACTION W/PHACO: SHX586

## 2016-12-21 DIAGNOSIS — H2511 Age-related nuclear cataract, right eye: Secondary | ICD-10-CM | POA: Diagnosis not present

## 2016-12-24 DIAGNOSIS — H2511 Age-related nuclear cataract, right eye: Secondary | ICD-10-CM | POA: Diagnosis not present

## 2016-12-24 DIAGNOSIS — H401112 Primary open-angle glaucoma, right eye, moderate stage: Secondary | ICD-10-CM | POA: Diagnosis not present

## 2017-01-20 DIAGNOSIS — L570 Actinic keratosis: Secondary | ICD-10-CM | POA: Diagnosis not present

## 2017-01-20 DIAGNOSIS — D485 Neoplasm of uncertain behavior of skin: Secondary | ICD-10-CM | POA: Diagnosis not present

## 2017-02-18 DIAGNOSIS — N400 Enlarged prostate without lower urinary tract symptoms: Secondary | ICD-10-CM | POA: Diagnosis not present

## 2017-02-18 DIAGNOSIS — E7800 Pure hypercholesterolemia, unspecified: Secondary | ICD-10-CM | POA: Diagnosis not present

## 2017-02-23 DIAGNOSIS — I10 Essential (primary) hypertension: Secondary | ICD-10-CM | POA: Diagnosis not present

## 2017-02-23 DIAGNOSIS — E785 Hyperlipidemia, unspecified: Secondary | ICD-10-CM | POA: Diagnosis not present

## 2017-02-23 DIAGNOSIS — R972 Elevated prostate specific antigen [PSA]: Secondary | ICD-10-CM | POA: Diagnosis not present

## 2017-04-12 DIAGNOSIS — J209 Acute bronchitis, unspecified: Secondary | ICD-10-CM | POA: Diagnosis not present

## 2017-04-12 DIAGNOSIS — J3489 Other specified disorders of nose and nasal sinuses: Secondary | ICD-10-CM | POA: Diagnosis not present

## 2017-04-12 DIAGNOSIS — J9801 Acute bronchospasm: Secondary | ICD-10-CM | POA: Diagnosis not present

## 2017-05-18 DIAGNOSIS — Z23 Encounter for immunization: Secondary | ICD-10-CM | POA: Diagnosis not present

## 2017-05-24 DIAGNOSIS — J9801 Acute bronchospasm: Secondary | ICD-10-CM | POA: Diagnosis not present

## 2017-05-24 DIAGNOSIS — J209 Acute bronchitis, unspecified: Secondary | ICD-10-CM | POA: Diagnosis not present

## 2017-05-24 DIAGNOSIS — R05 Cough: Secondary | ICD-10-CM | POA: Diagnosis not present

## 2017-05-24 DIAGNOSIS — J449 Chronic obstructive pulmonary disease, unspecified: Secondary | ICD-10-CM | POA: Diagnosis not present

## 2017-05-25 DIAGNOSIS — H2511 Age-related nuclear cataract, right eye: Secondary | ICD-10-CM | POA: Diagnosis not present

## 2017-05-25 DIAGNOSIS — H353212 Exudative age-related macular degeneration, right eye, with inactive choroidal neovascularization: Secondary | ICD-10-CM | POA: Diagnosis not present

## 2017-05-25 DIAGNOSIS — H353124 Nonexudative age-related macular degeneration, left eye, advanced atrophic with subfoveal involvement: Secondary | ICD-10-CM | POA: Diagnosis not present

## 2017-05-25 DIAGNOSIS — H353113 Nonexudative age-related macular degeneration, right eye, advanced atrophic without subfoveal involvement: Secondary | ICD-10-CM | POA: Diagnosis not present

## 2017-06-29 DIAGNOSIS — H353132 Nonexudative age-related macular degeneration, bilateral, intermediate dry stage: Secondary | ICD-10-CM | POA: Diagnosis not present

## 2017-06-29 DIAGNOSIS — Z961 Presence of intraocular lens: Secondary | ICD-10-CM | POA: Diagnosis not present

## 2017-06-29 DIAGNOSIS — H401132 Primary open-angle glaucoma, bilateral, moderate stage: Secondary | ICD-10-CM | POA: Diagnosis not present

## 2017-08-24 DIAGNOSIS — R972 Elevated prostate specific antigen [PSA]: Secondary | ICD-10-CM | POA: Diagnosis not present

## 2017-08-24 DIAGNOSIS — I10 Essential (primary) hypertension: Secondary | ICD-10-CM | POA: Diagnosis not present

## 2017-08-31 DIAGNOSIS — R739 Hyperglycemia, unspecified: Secondary | ICD-10-CM | POA: Diagnosis not present

## 2017-08-31 DIAGNOSIS — R200 Anesthesia of skin: Secondary | ICD-10-CM | POA: Diagnosis not present

## 2017-08-31 DIAGNOSIS — E785 Hyperlipidemia, unspecified: Secondary | ICD-10-CM | POA: Diagnosis not present

## 2017-08-31 DIAGNOSIS — Z0000 Encounter for general adult medical examination without abnormal findings: Secondary | ICD-10-CM | POA: Diagnosis not present

## 2017-08-31 DIAGNOSIS — R202 Paresthesia of skin: Secondary | ICD-10-CM | POA: Diagnosis not present

## 2017-08-31 DIAGNOSIS — R972 Elevated prostate specific antigen [PSA]: Secondary | ICD-10-CM | POA: Diagnosis not present

## 2017-08-31 DIAGNOSIS — I10 Essential (primary) hypertension: Secondary | ICD-10-CM | POA: Diagnosis not present

## 2017-09-16 DIAGNOSIS — J020 Streptococcal pharyngitis: Secondary | ICD-10-CM | POA: Diagnosis not present

## 2017-09-28 DIAGNOSIS — D1801 Hemangioma of skin and subcutaneous tissue: Secondary | ICD-10-CM | POA: Diagnosis not present

## 2017-09-28 DIAGNOSIS — D485 Neoplasm of uncertain behavior of skin: Secondary | ICD-10-CM | POA: Diagnosis not present

## 2017-09-28 DIAGNOSIS — L814 Other melanin hyperpigmentation: Secondary | ICD-10-CM | POA: Diagnosis not present

## 2017-11-02 DIAGNOSIS — H401132 Primary open-angle glaucoma, bilateral, moderate stage: Secondary | ICD-10-CM | POA: Diagnosis not present

## 2017-11-02 DIAGNOSIS — H353132 Nonexudative age-related macular degeneration, bilateral, intermediate dry stage: Secondary | ICD-10-CM | POA: Diagnosis not present

## 2017-11-02 DIAGNOSIS — Z961 Presence of intraocular lens: Secondary | ICD-10-CM | POA: Diagnosis not present

## 2017-11-30 ENCOUNTER — Other Ambulatory Visit (HOSPITAL_COMMUNITY): Payer: Self-pay | Admitting: Internal Medicine

## 2017-11-30 DIAGNOSIS — R600 Localized edema: Secondary | ICD-10-CM | POA: Diagnosis not present

## 2017-11-30 DIAGNOSIS — E785 Hyperlipidemia, unspecified: Secondary | ICD-10-CM | POA: Diagnosis not present

## 2017-11-30 DIAGNOSIS — I10 Essential (primary) hypertension: Secondary | ICD-10-CM | POA: Diagnosis not present

## 2017-11-30 DIAGNOSIS — J449 Chronic obstructive pulmonary disease, unspecified: Secondary | ICD-10-CM | POA: Diagnosis not present

## 2017-12-01 ENCOUNTER — Ambulatory Visit (HOSPITAL_COMMUNITY)
Admission: RE | Admit: 2017-12-01 | Discharge: 2017-12-01 | Disposition: A | Discharge status: Home / Self Care | Payer: PPO | Source: Ambulatory Visit | Attending: Family Medicine | Admitting: Family Medicine

## 2017-12-01 DIAGNOSIS — I10 Essential (primary) hypertension: Secondary | ICD-10-CM | POA: Diagnosis not present

## 2017-12-01 DIAGNOSIS — I82890 Acute embolism and thrombosis of other specified veins: Secondary | ICD-10-CM | POA: Diagnosis not present

## 2017-12-01 DIAGNOSIS — I82401 Acute embolism and thrombosis of unspecified deep veins of right lower extremity: Secondary | ICD-10-CM | POA: Diagnosis not present

## 2017-12-01 DIAGNOSIS — I82441 Acute embolism and thrombosis of right tibial vein: Secondary | ICD-10-CM | POA: Insufficient documentation

## 2017-12-01 DIAGNOSIS — I82431 Acute embolism and thrombosis of right popliteal vein: Secondary | ICD-10-CM | POA: Insufficient documentation

## 2017-12-01 DIAGNOSIS — R600 Localized edema: Secondary | ICD-10-CM | POA: Insufficient documentation

## 2017-12-01 DIAGNOSIS — J449 Chronic obstructive pulmonary disease, unspecified: Secondary | ICD-10-CM | POA: Diagnosis not present

## 2017-12-01 DIAGNOSIS — E785 Hyperlipidemia, unspecified: Secondary | ICD-10-CM | POA: Diagnosis not present

## 2018-01-03 DIAGNOSIS — I82401 Acute embolism and thrombosis of unspecified deep veins of right lower extremity: Secondary | ICD-10-CM | POA: Diagnosis not present

## 2018-01-24 DIAGNOSIS — E119 Type 2 diabetes mellitus without complications: Secondary | ICD-10-CM | POA: Diagnosis not present

## 2018-01-24 DIAGNOSIS — E039 Hypothyroidism, unspecified: Secondary | ICD-10-CM | POA: Diagnosis not present

## 2018-01-24 DIAGNOSIS — N419 Inflammatory disease of prostate, unspecified: Secondary | ICD-10-CM | POA: Diagnosis not present

## 2018-01-24 DIAGNOSIS — N390 Urinary tract infection, site not specified: Secondary | ICD-10-CM | POA: Diagnosis not present

## 2018-01-24 DIAGNOSIS — E7800 Pure hypercholesterolemia, unspecified: Secondary | ICD-10-CM | POA: Diagnosis not present

## 2018-01-24 DIAGNOSIS — J209 Acute bronchitis, unspecified: Secondary | ICD-10-CM | POA: Diagnosis not present

## 2018-01-24 DIAGNOSIS — J449 Chronic obstructive pulmonary disease, unspecified: Secondary | ICD-10-CM | POA: Diagnosis not present

## 2018-01-24 DIAGNOSIS — J9801 Acute bronchospasm: Secondary | ICD-10-CM | POA: Diagnosis not present

## 2018-01-25 DIAGNOSIS — H353132 Nonexudative age-related macular degeneration, bilateral, intermediate dry stage: Secondary | ICD-10-CM | POA: Diagnosis not present

## 2018-01-25 DIAGNOSIS — Z961 Presence of intraocular lens: Secondary | ICD-10-CM | POA: Diagnosis not present

## 2018-01-25 DIAGNOSIS — H401132 Primary open-angle glaucoma, bilateral, moderate stage: Secondary | ICD-10-CM | POA: Diagnosis not present

## 2018-01-27 DIAGNOSIS — F419 Anxiety disorder, unspecified: Secondary | ICD-10-CM | POA: Diagnosis not present

## 2018-01-27 DIAGNOSIS — F4321 Adjustment disorder with depressed mood: Secondary | ICD-10-CM | POA: Diagnosis not present

## 2018-01-27 DIAGNOSIS — R799 Abnormal finding of blood chemistry, unspecified: Secondary | ICD-10-CM | POA: Diagnosis not present

## 2018-02-04 ENCOUNTER — Other Ambulatory Visit (HOSPITAL_COMMUNITY): Payer: Self-pay | Admitting: Nurse Practitioner

## 2018-02-04 ENCOUNTER — Ambulatory Visit (HOSPITAL_COMMUNITY)
Admission: RE | Admit: 2018-02-04 | Discharge: 2018-02-04 | Disposition: A | Discharge status: Home / Self Care | Payer: PPO | Source: Ambulatory Visit | Attending: Nurse Practitioner | Admitting: Nurse Practitioner

## 2018-02-04 DIAGNOSIS — R062 Wheezing: Secondary | ICD-10-CM | POA: Diagnosis not present

## 2018-02-04 DIAGNOSIS — R0602 Shortness of breath: Secondary | ICD-10-CM

## 2018-02-04 DIAGNOSIS — J449 Chronic obstructive pulmonary disease, unspecified: Secondary | ICD-10-CM | POA: Diagnosis not present

## 2018-02-04 DIAGNOSIS — R05 Cough: Secondary | ICD-10-CM | POA: Diagnosis not present

## 2018-02-04 DIAGNOSIS — J209 Acute bronchitis, unspecified: Secondary | ICD-10-CM | POA: Diagnosis not present

## 2018-02-04 DIAGNOSIS — J9801 Acute bronchospasm: Secondary | ICD-10-CM | POA: Diagnosis not present

## 2018-02-09 ENCOUNTER — Other Ambulatory Visit (HOSPITAL_COMMUNITY): Payer: Self-pay | Admitting: Internal Medicine

## 2018-02-09 DIAGNOSIS — I82401 Acute embolism and thrombosis of unspecified deep veins of right lower extremity: Secondary | ICD-10-CM

## 2018-02-10 ENCOUNTER — Ambulatory Visit (HOSPITAL_COMMUNITY)
Admission: RE | Admit: 2018-02-10 | Discharge: 2018-02-10 | Disposition: A | Discharge status: Home / Self Care | Payer: PPO | Source: Ambulatory Visit | Attending: Vascular Surgery | Admitting: Vascular Surgery

## 2018-02-10 DIAGNOSIS — I82431 Acute embolism and thrombosis of right popliteal vein: Secondary | ICD-10-CM | POA: Diagnosis not present

## 2018-02-10 DIAGNOSIS — I82401 Acute embolism and thrombosis of unspecified deep veins of right lower extremity: Secondary | ICD-10-CM | POA: Diagnosis not present

## 2018-02-22 DIAGNOSIS — H43811 Vitreous degeneration, right eye: Secondary | ICD-10-CM | POA: Diagnosis not present

## 2018-02-22 DIAGNOSIS — Z961 Presence of intraocular lens: Secondary | ICD-10-CM | POA: Diagnosis not present

## 2018-02-22 DIAGNOSIS — H353113 Nonexudative age-related macular degeneration, right eye, advanced atrophic without subfoveal involvement: Secondary | ICD-10-CM | POA: Diagnosis not present

## 2018-02-22 DIAGNOSIS — H353211 Exudative age-related macular degeneration, right eye, with active choroidal neovascularization: Secondary | ICD-10-CM | POA: Diagnosis not present

## 2018-02-22 DIAGNOSIS — I10 Essential (primary) hypertension: Secondary | ICD-10-CM | POA: Diagnosis not present

## 2018-02-22 DIAGNOSIS — H353124 Nonexudative age-related macular degeneration, left eye, advanced atrophic with subfoveal involvement: Secondary | ICD-10-CM | POA: Diagnosis not present

## 2018-02-22 DIAGNOSIS — R739 Hyperglycemia, unspecified: Secondary | ICD-10-CM | POA: Diagnosis not present

## 2018-02-25 DIAGNOSIS — R972 Elevated prostate specific antigen [PSA]: Secondary | ICD-10-CM | POA: Diagnosis not present

## 2018-02-25 DIAGNOSIS — N401 Enlarged prostate with lower urinary tract symptoms: Secondary | ICD-10-CM | POA: Diagnosis not present

## 2018-02-25 DIAGNOSIS — R3912 Poor urinary stream: Secondary | ICD-10-CM | POA: Diagnosis not present

## 2018-03-01 DIAGNOSIS — E785 Hyperlipidemia, unspecified: Secondary | ICD-10-CM | POA: Diagnosis not present

## 2018-03-01 DIAGNOSIS — I10 Essential (primary) hypertension: Secondary | ICD-10-CM | POA: Diagnosis not present

## 2018-03-01 DIAGNOSIS — J449 Chronic obstructive pulmonary disease, unspecified: Secondary | ICD-10-CM | POA: Diagnosis not present

## 2018-03-01 DIAGNOSIS — Z13820 Encounter for screening for osteoporosis: Secondary | ICD-10-CM | POA: Diagnosis not present

## 2018-03-01 DIAGNOSIS — I82401 Acute embolism and thrombosis of unspecified deep veins of right lower extremity: Secondary | ICD-10-CM | POA: Diagnosis not present

## 2018-03-07 DIAGNOSIS — Z13820 Encounter for screening for osteoporosis: Secondary | ICD-10-CM | POA: Diagnosis not present

## 2018-03-15 DIAGNOSIS — H353132 Nonexudative age-related macular degeneration, bilateral, intermediate dry stage: Secondary | ICD-10-CM | POA: Diagnosis not present

## 2018-03-15 DIAGNOSIS — H401132 Primary open-angle glaucoma, bilateral, moderate stage: Secondary | ICD-10-CM | POA: Diagnosis not present

## 2018-03-15 DIAGNOSIS — Z961 Presence of intraocular lens: Secondary | ICD-10-CM | POA: Diagnosis not present

## 2018-03-24 DIAGNOSIS — H43811 Vitreous degeneration, right eye: Secondary | ICD-10-CM | POA: Diagnosis not present

## 2018-03-24 DIAGNOSIS — H353113 Nonexudative age-related macular degeneration, right eye, advanced atrophic without subfoveal involvement: Secondary | ICD-10-CM | POA: Diagnosis not present

## 2018-03-24 DIAGNOSIS — H353211 Exudative age-related macular degeneration, right eye, with active choroidal neovascularization: Secondary | ICD-10-CM | POA: Diagnosis not present

## 2018-03-24 DIAGNOSIS — H353124 Nonexudative age-related macular degeneration, left eye, advanced atrophic with subfoveal involvement: Secondary | ICD-10-CM | POA: Diagnosis not present

## 2018-04-08 DIAGNOSIS — R21 Rash and other nonspecific skin eruption: Secondary | ICD-10-CM | POA: Diagnosis not present

## 2018-04-28 DIAGNOSIS — H35361 Drusen (degenerative) of macula, right eye: Secondary | ICD-10-CM | POA: Diagnosis not present

## 2018-04-28 DIAGNOSIS — H353211 Exudative age-related macular degeneration, right eye, with active choroidal neovascularization: Secondary | ICD-10-CM | POA: Diagnosis not present

## 2018-04-28 DIAGNOSIS — H353113 Nonexudative age-related macular degeneration, right eye, advanced atrophic without subfoveal involvement: Secondary | ICD-10-CM | POA: Diagnosis not present

## 2018-04-28 DIAGNOSIS — H353124 Nonexudative age-related macular degeneration, left eye, advanced atrophic with subfoveal involvement: Secondary | ICD-10-CM | POA: Diagnosis not present

## 2018-05-17 DIAGNOSIS — Z23 Encounter for immunization: Secondary | ICD-10-CM | POA: Diagnosis not present

## 2018-06-16 DIAGNOSIS — H353113 Nonexudative age-related macular degeneration, right eye, advanced atrophic without subfoveal involvement: Secondary | ICD-10-CM | POA: Diagnosis not present

## 2018-06-16 DIAGNOSIS — H43811 Vitreous degeneration, right eye: Secondary | ICD-10-CM | POA: Diagnosis not present

## 2018-06-16 DIAGNOSIS — H353211 Exudative age-related macular degeneration, right eye, with active choroidal neovascularization: Secondary | ICD-10-CM | POA: Diagnosis not present

## 2018-07-18 DIAGNOSIS — H01021 Squamous blepharitis right upper eyelid: Secondary | ICD-10-CM | POA: Diagnosis not present

## 2018-07-18 DIAGNOSIS — H353132 Nonexudative age-related macular degeneration, bilateral, intermediate dry stage: Secondary | ICD-10-CM | POA: Diagnosis not present

## 2018-07-18 DIAGNOSIS — Z961 Presence of intraocular lens: Secondary | ICD-10-CM | POA: Diagnosis not present

## 2018-07-18 DIAGNOSIS — H01025 Squamous blepharitis left lower eyelid: Secondary | ICD-10-CM | POA: Diagnosis not present

## 2018-07-18 DIAGNOSIS — H01024 Squamous blepharitis left upper eyelid: Secondary | ICD-10-CM | POA: Diagnosis not present

## 2018-07-18 DIAGNOSIS — H401132 Primary open-angle glaucoma, bilateral, moderate stage: Secondary | ICD-10-CM | POA: Diagnosis not present

## 2018-07-18 DIAGNOSIS — H01022 Squamous blepharitis right lower eyelid: Secondary | ICD-10-CM | POA: Diagnosis not present

## 2018-07-27 DIAGNOSIS — J410 Simple chronic bronchitis: Secondary | ICD-10-CM | POA: Diagnosis not present

## 2018-07-27 DIAGNOSIS — I129 Hypertensive chronic kidney disease with stage 1 through stage 4 chronic kidney disease, or unspecified chronic kidney disease: Secondary | ICD-10-CM | POA: Diagnosis not present

## 2018-07-27 DIAGNOSIS — N400 Enlarged prostate without lower urinary tract symptoms: Secondary | ICD-10-CM | POA: Diagnosis not present

## 2018-07-27 DIAGNOSIS — G629 Polyneuropathy, unspecified: Secondary | ICD-10-CM | POA: Diagnosis not present

## 2018-07-27 DIAGNOSIS — I150 Renovascular hypertension: Secondary | ICD-10-CM | POA: Diagnosis not present

## 2018-07-27 DIAGNOSIS — I82461 Acute embolism and thrombosis of right calf muscular vein: Secondary | ICD-10-CM | POA: Diagnosis not present

## 2018-07-27 DIAGNOSIS — N183 Chronic kidney disease, stage 3 (moderate): Secondary | ICD-10-CM | POA: Diagnosis not present

## 2018-07-27 DIAGNOSIS — E7800 Pure hypercholesterolemia, unspecified: Secondary | ICD-10-CM | POA: Diagnosis not present

## 2018-07-27 DIAGNOSIS — M48061 Spinal stenosis, lumbar region without neurogenic claudication: Secondary | ICD-10-CM | POA: Diagnosis not present

## 2018-07-27 DIAGNOSIS — H3530 Unspecified macular degeneration: Secondary | ICD-10-CM | POA: Diagnosis not present

## 2018-08-12 DIAGNOSIS — E7800 Pure hypercholesterolemia, unspecified: Secondary | ICD-10-CM | POA: Diagnosis not present

## 2018-08-12 DIAGNOSIS — N183 Chronic kidney disease, stage 3 (moderate): Secondary | ICD-10-CM | POA: Diagnosis not present

## 2018-08-12 DIAGNOSIS — J410 Simple chronic bronchitis: Secondary | ICD-10-CM | POA: Diagnosis not present

## 2018-08-12 DIAGNOSIS — I150 Renovascular hypertension: Secondary | ICD-10-CM | POA: Diagnosis not present

## 2018-08-12 DIAGNOSIS — E782 Mixed hyperlipidemia: Secondary | ICD-10-CM | POA: Diagnosis not present

## 2018-08-12 DIAGNOSIS — J45909 Unspecified asthma, uncomplicated: Secondary | ICD-10-CM | POA: Diagnosis not present

## 2018-08-12 DIAGNOSIS — I129 Hypertensive chronic kidney disease with stage 1 through stage 4 chronic kidney disease, or unspecified chronic kidney disease: Secondary | ICD-10-CM | POA: Diagnosis not present

## 2018-08-12 DIAGNOSIS — N400 Enlarged prostate without lower urinary tract symptoms: Secondary | ICD-10-CM | POA: Diagnosis not present

## 2018-08-15 DIAGNOSIS — N400 Enlarged prostate without lower urinary tract symptoms: Secondary | ICD-10-CM | POA: Diagnosis not present

## 2018-08-15 DIAGNOSIS — J410 Simple chronic bronchitis: Secondary | ICD-10-CM | POA: Diagnosis not present

## 2018-08-15 DIAGNOSIS — E782 Mixed hyperlipidemia: Secondary | ICD-10-CM | POA: Diagnosis not present

## 2018-08-15 DIAGNOSIS — I129 Hypertensive chronic kidney disease with stage 1 through stage 4 chronic kidney disease, or unspecified chronic kidney disease: Secondary | ICD-10-CM | POA: Diagnosis not present

## 2018-08-15 DIAGNOSIS — J45909 Unspecified asthma, uncomplicated: Secondary | ICD-10-CM | POA: Diagnosis not present

## 2018-08-15 DIAGNOSIS — N183 Chronic kidney disease, stage 3 (moderate): Secondary | ICD-10-CM | POA: Diagnosis not present

## 2018-08-15 DIAGNOSIS — I150 Renovascular hypertension: Secondary | ICD-10-CM | POA: Diagnosis not present

## 2018-08-25 DIAGNOSIS — H43811 Vitreous degeneration, right eye: Secondary | ICD-10-CM | POA: Diagnosis not present

## 2018-08-25 DIAGNOSIS — H353211 Exudative age-related macular degeneration, right eye, with active choroidal neovascularization: Secondary | ICD-10-CM | POA: Diagnosis not present

## 2018-09-13 ENCOUNTER — Ambulatory Visit
Admission: RE | Admit: 2018-09-13 | Discharge: 2018-09-13 | Disposition: A | Discharge status: Home / Self Care | Payer: PPO | Source: Ambulatory Visit | Attending: Nurse Practitioner | Admitting: Nurse Practitioner

## 2018-09-13 ENCOUNTER — Other Ambulatory Visit: Payer: Self-pay | Admitting: Nurse Practitioner

## 2018-09-13 DIAGNOSIS — J45909 Unspecified asthma, uncomplicated: Secondary | ICD-10-CM

## 2018-09-13 DIAGNOSIS — Z8709 Personal history of other diseases of the respiratory system: Secondary | ICD-10-CM

## 2018-09-13 DIAGNOSIS — J449 Chronic obstructive pulmonary disease, unspecified: Secondary | ICD-10-CM | POA: Diagnosis not present

## 2018-10-10 DIAGNOSIS — N183 Chronic kidney disease, stage 3 (moderate): Secondary | ICD-10-CM | POA: Diagnosis not present

## 2018-10-10 DIAGNOSIS — J410 Simple chronic bronchitis: Secondary | ICD-10-CM | POA: Diagnosis not present

## 2018-10-10 DIAGNOSIS — J45909 Unspecified asthma, uncomplicated: Secondary | ICD-10-CM | POA: Diagnosis not present

## 2018-10-10 DIAGNOSIS — I129 Hypertensive chronic kidney disease with stage 1 through stage 4 chronic kidney disease, or unspecified chronic kidney disease: Secondary | ICD-10-CM | POA: Diagnosis not present

## 2018-10-10 DIAGNOSIS — I150 Renovascular hypertension: Secondary | ICD-10-CM | POA: Diagnosis not present

## 2018-10-10 DIAGNOSIS — E782 Mixed hyperlipidemia: Secondary | ICD-10-CM | POA: Diagnosis not present

## 2018-10-10 DIAGNOSIS — N400 Enlarged prostate without lower urinary tract symptoms: Secondary | ICD-10-CM | POA: Diagnosis not present

## 2018-10-25 DIAGNOSIS — I129 Hypertensive chronic kidney disease with stage 1 through stage 4 chronic kidney disease, or unspecified chronic kidney disease: Secondary | ICD-10-CM | POA: Diagnosis not present

## 2018-10-25 DIAGNOSIS — I82491 Acute embolism and thrombosis of other specified deep vein of right lower extremity: Secondary | ICD-10-CM | POA: Diagnosis not present

## 2018-10-25 DIAGNOSIS — N183 Chronic kidney disease, stage 3 (moderate): Secondary | ICD-10-CM | POA: Diagnosis not present

## 2018-11-10 DIAGNOSIS — H353211 Exudative age-related macular degeneration, right eye, with active choroidal neovascularization: Secondary | ICD-10-CM | POA: Diagnosis not present

## 2018-11-10 DIAGNOSIS — H353113 Nonexudative age-related macular degeneration, right eye, advanced atrophic without subfoveal involvement: Secondary | ICD-10-CM | POA: Diagnosis not present

## 2018-11-10 DIAGNOSIS — H353124 Nonexudative age-related macular degeneration, left eye, advanced atrophic with subfoveal involvement: Secondary | ICD-10-CM | POA: Diagnosis not present

## 2018-11-10 DIAGNOSIS — H43811 Vitreous degeneration, right eye: Secondary | ICD-10-CM | POA: Diagnosis not present

## 2018-12-09 DIAGNOSIS — R52 Pain, unspecified: Secondary | ICD-10-CM | POA: Diagnosis not present

## 2018-12-09 DIAGNOSIS — J019 Acute sinusitis, unspecified: Secondary | ICD-10-CM | POA: Diagnosis not present

## 2018-12-26 DIAGNOSIS — J45909 Unspecified asthma, uncomplicated: Secondary | ICD-10-CM | POA: Diagnosis not present

## 2018-12-26 DIAGNOSIS — N183 Chronic kidney disease, stage 3 (moderate): Secondary | ICD-10-CM | POA: Diagnosis not present

## 2018-12-26 DIAGNOSIS — J410 Simple chronic bronchitis: Secondary | ICD-10-CM | POA: Diagnosis not present

## 2018-12-26 DIAGNOSIS — E782 Mixed hyperlipidemia: Secondary | ICD-10-CM | POA: Diagnosis not present

## 2018-12-26 DIAGNOSIS — I129 Hypertensive chronic kidney disease with stage 1 through stage 4 chronic kidney disease, or unspecified chronic kidney disease: Secondary | ICD-10-CM | POA: Diagnosis not present

## 2019-01-26 DIAGNOSIS — H353124 Nonexudative age-related macular degeneration, left eye, advanced atrophic with subfoveal involvement: Secondary | ICD-10-CM | POA: Diagnosis not present

## 2019-01-26 DIAGNOSIS — H353211 Exudative age-related macular degeneration, right eye, with active choroidal neovascularization: Secondary | ICD-10-CM | POA: Diagnosis not present

## 2019-01-26 DIAGNOSIS — H353113 Nonexudative age-related macular degeneration, right eye, advanced atrophic without subfoveal involvement: Secondary | ICD-10-CM | POA: Diagnosis not present

## 2019-01-26 DIAGNOSIS — H43811 Vitreous degeneration, right eye: Secondary | ICD-10-CM | POA: Diagnosis not present

## 2019-01-27 DIAGNOSIS — J449 Chronic obstructive pulmonary disease, unspecified: Secondary | ICD-10-CM | POA: Diagnosis not present

## 2019-01-27 DIAGNOSIS — G250 Essential tremor: Secondary | ICD-10-CM | POA: Diagnosis not present

## 2019-01-31 DIAGNOSIS — H353132 Nonexudative age-related macular degeneration, bilateral, intermediate dry stage: Secondary | ICD-10-CM | POA: Diagnosis not present

## 2019-01-31 DIAGNOSIS — H0102A Squamous blepharitis right eye, upper and lower eyelids: Secondary | ICD-10-CM | POA: Diagnosis not present

## 2019-01-31 DIAGNOSIS — H0102B Squamous blepharitis left eye, upper and lower eyelids: Secondary | ICD-10-CM | POA: Diagnosis not present

## 2019-01-31 DIAGNOSIS — H401132 Primary open-angle glaucoma, bilateral, moderate stage: Secondary | ICD-10-CM | POA: Diagnosis not present

## 2019-01-31 DIAGNOSIS — Z961 Presence of intraocular lens: Secondary | ICD-10-CM | POA: Diagnosis not present

## 2019-02-03 DIAGNOSIS — J45909 Unspecified asthma, uncomplicated: Secondary | ICD-10-CM | POA: Diagnosis not present

## 2019-02-03 DIAGNOSIS — J410 Simple chronic bronchitis: Secondary | ICD-10-CM | POA: Diagnosis not present

## 2019-02-03 DIAGNOSIS — E782 Mixed hyperlipidemia: Secondary | ICD-10-CM | POA: Diagnosis not present

## 2019-02-03 DIAGNOSIS — N183 Chronic kidney disease, stage 3 (moderate): Secondary | ICD-10-CM | POA: Diagnosis not present

## 2019-02-03 DIAGNOSIS — I129 Hypertensive chronic kidney disease with stage 1 through stage 4 chronic kidney disease, or unspecified chronic kidney disease: Secondary | ICD-10-CM | POA: Diagnosis not present

## 2019-02-03 DIAGNOSIS — J449 Chronic obstructive pulmonary disease, unspecified: Secondary | ICD-10-CM | POA: Diagnosis not present

## 2019-02-22 DIAGNOSIS — E782 Mixed hyperlipidemia: Secondary | ICD-10-CM | POA: Diagnosis not present

## 2019-02-22 DIAGNOSIS — I129 Hypertensive chronic kidney disease with stage 1 through stage 4 chronic kidney disease, or unspecified chronic kidney disease: Secondary | ICD-10-CM | POA: Diagnosis not present

## 2019-02-22 DIAGNOSIS — J410 Simple chronic bronchitis: Secondary | ICD-10-CM | POA: Diagnosis not present

## 2019-02-22 DIAGNOSIS — N183 Chronic kidney disease, stage 3 (moderate): Secondary | ICD-10-CM | POA: Diagnosis not present

## 2019-02-22 DIAGNOSIS — J45909 Unspecified asthma, uncomplicated: Secondary | ICD-10-CM | POA: Diagnosis not present

## 2019-02-22 DIAGNOSIS — J449 Chronic obstructive pulmonary disease, unspecified: Secondary | ICD-10-CM | POA: Diagnosis not present

## 2019-02-24 DIAGNOSIS — Z79899 Other long term (current) drug therapy: Secondary | ICD-10-CM | POA: Diagnosis not present

## 2019-03-29 DIAGNOSIS — H35721 Serous detachment of retinal pigment epithelium, right eye: Secondary | ICD-10-CM | POA: Diagnosis not present

## 2019-03-29 DIAGNOSIS — H353211 Exudative age-related macular degeneration, right eye, with active choroidal neovascularization: Secondary | ICD-10-CM | POA: Diagnosis not present

## 2019-03-29 DIAGNOSIS — H353113 Nonexudative age-related macular degeneration, right eye, advanced atrophic without subfoveal involvement: Secondary | ICD-10-CM | POA: Diagnosis not present

## 2019-03-29 DIAGNOSIS — H43811 Vitreous degeneration, right eye: Secondary | ICD-10-CM | POA: Diagnosis not present

## 2019-03-31 DIAGNOSIS — E782 Mixed hyperlipidemia: Secondary | ICD-10-CM | POA: Diagnosis not present

## 2019-03-31 DIAGNOSIS — Z23 Encounter for immunization: Secondary | ICD-10-CM | POA: Diagnosis not present

## 2019-03-31 DIAGNOSIS — J449 Chronic obstructive pulmonary disease, unspecified: Secondary | ICD-10-CM | POA: Diagnosis not present

## 2019-03-31 DIAGNOSIS — G250 Essential tremor: Secondary | ICD-10-CM | POA: Diagnosis not present

## 2019-04-04 DIAGNOSIS — I129 Hypertensive chronic kidney disease with stage 1 through stage 4 chronic kidney disease, or unspecified chronic kidney disease: Secondary | ICD-10-CM | POA: Diagnosis not present

## 2019-04-04 DIAGNOSIS — J45909 Unspecified asthma, uncomplicated: Secondary | ICD-10-CM | POA: Diagnosis not present

## 2019-04-04 DIAGNOSIS — N183 Chronic kidney disease, stage 3 (moderate): Secondary | ICD-10-CM | POA: Diagnosis not present

## 2019-04-04 DIAGNOSIS — E782 Mixed hyperlipidemia: Secondary | ICD-10-CM | POA: Diagnosis not present

## 2019-04-04 DIAGNOSIS — J449 Chronic obstructive pulmonary disease, unspecified: Secondary | ICD-10-CM | POA: Diagnosis not present

## 2019-04-04 DIAGNOSIS — J410 Simple chronic bronchitis: Secondary | ICD-10-CM | POA: Diagnosis not present

## 2019-05-04 DIAGNOSIS — J449 Chronic obstructive pulmonary disease, unspecified: Secondary | ICD-10-CM | POA: Diagnosis not present

## 2019-05-04 DIAGNOSIS — I129 Hypertensive chronic kidney disease with stage 1 through stage 4 chronic kidney disease, or unspecified chronic kidney disease: Secondary | ICD-10-CM | POA: Diagnosis not present

## 2019-05-04 DIAGNOSIS — J45909 Unspecified asthma, uncomplicated: Secondary | ICD-10-CM | POA: Diagnosis not present

## 2019-05-04 DIAGNOSIS — E782 Mixed hyperlipidemia: Secondary | ICD-10-CM | POA: Diagnosis not present

## 2019-05-04 DIAGNOSIS — N1831 Chronic kidney disease, stage 3a: Secondary | ICD-10-CM | POA: Diagnosis not present

## 2019-05-04 DIAGNOSIS — J410 Simple chronic bronchitis: Secondary | ICD-10-CM | POA: Diagnosis not present

## 2019-05-18 DIAGNOSIS — H35721 Serous detachment of retinal pigment epithelium, right eye: Secondary | ICD-10-CM | POA: Diagnosis not present

## 2019-05-18 DIAGNOSIS — H353113 Nonexudative age-related macular degeneration, right eye, advanced atrophic without subfoveal involvement: Secondary | ICD-10-CM | POA: Diagnosis not present

## 2019-05-18 DIAGNOSIS — H353124 Nonexudative age-related macular degeneration, left eye, advanced atrophic with subfoveal involvement: Secondary | ICD-10-CM | POA: Diagnosis not present

## 2019-05-18 DIAGNOSIS — H353211 Exudative age-related macular degeneration, right eye, with active choroidal neovascularization: Secondary | ICD-10-CM | POA: Diagnosis not present

## 2019-05-22 DIAGNOSIS — J019 Acute sinusitis, unspecified: Secondary | ICD-10-CM | POA: Diagnosis not present

## 2019-05-22 DIAGNOSIS — R52 Pain, unspecified: Secondary | ICD-10-CM | POA: Diagnosis not present

## 2019-06-06 DIAGNOSIS — H401132 Primary open-angle glaucoma, bilateral, moderate stage: Secondary | ICD-10-CM | POA: Diagnosis not present

## 2019-06-06 DIAGNOSIS — H353132 Nonexudative age-related macular degeneration, bilateral, intermediate dry stage: Secondary | ICD-10-CM | POA: Diagnosis not present

## 2019-06-06 DIAGNOSIS — H0102A Squamous blepharitis right eye, upper and lower eyelids: Secondary | ICD-10-CM | POA: Diagnosis not present

## 2019-06-06 DIAGNOSIS — H0102B Squamous blepharitis left eye, upper and lower eyelids: Secondary | ICD-10-CM | POA: Diagnosis not present

## 2019-06-06 DIAGNOSIS — Z961 Presence of intraocular lens: Secondary | ICD-10-CM | POA: Diagnosis not present

## 2019-06-12 DIAGNOSIS — N1831 Chronic kidney disease, stage 3a: Secondary | ICD-10-CM | POA: Diagnosis not present

## 2019-06-12 DIAGNOSIS — J410 Simple chronic bronchitis: Secondary | ICD-10-CM | POA: Diagnosis not present

## 2019-06-12 DIAGNOSIS — I129 Hypertensive chronic kidney disease with stage 1 through stage 4 chronic kidney disease, or unspecified chronic kidney disease: Secondary | ICD-10-CM | POA: Diagnosis not present

## 2019-06-12 DIAGNOSIS — E782 Mixed hyperlipidemia: Secondary | ICD-10-CM | POA: Diagnosis not present

## 2019-06-12 DIAGNOSIS — J449 Chronic obstructive pulmonary disease, unspecified: Secondary | ICD-10-CM | POA: Diagnosis not present

## 2019-06-12 DIAGNOSIS — J45909 Unspecified asthma, uncomplicated: Secondary | ICD-10-CM | POA: Diagnosis not present

## 2019-06-29 DIAGNOSIS — H43811 Vitreous degeneration, right eye: Secondary | ICD-10-CM | POA: Diagnosis not present

## 2019-06-29 DIAGNOSIS — H35721 Serous detachment of retinal pigment epithelium, right eye: Secondary | ICD-10-CM | POA: Diagnosis not present

## 2019-06-29 DIAGNOSIS — H353211 Exudative age-related macular degeneration, right eye, with active choroidal neovascularization: Secondary | ICD-10-CM | POA: Diagnosis not present

## 2019-07-12 DIAGNOSIS — I129 Hypertensive chronic kidney disease with stage 1 through stage 4 chronic kidney disease, or unspecified chronic kidney disease: Secondary | ICD-10-CM | POA: Diagnosis not present

## 2019-07-12 DIAGNOSIS — J410 Simple chronic bronchitis: Secondary | ICD-10-CM | POA: Diagnosis not present

## 2019-07-12 DIAGNOSIS — N1831 Chronic kidney disease, stage 3a: Secondary | ICD-10-CM | POA: Diagnosis not present

## 2019-07-12 DIAGNOSIS — J45909 Unspecified asthma, uncomplicated: Secondary | ICD-10-CM | POA: Diagnosis not present

## 2019-07-12 DIAGNOSIS — J449 Chronic obstructive pulmonary disease, unspecified: Secondary | ICD-10-CM | POA: Diagnosis not present

## 2019-07-12 DIAGNOSIS — E782 Mixed hyperlipidemia: Secondary | ICD-10-CM | POA: Diagnosis not present

## 2019-07-27 DIAGNOSIS — H353113 Nonexudative age-related macular degeneration, right eye, advanced atrophic without subfoveal involvement: Secondary | ICD-10-CM | POA: Diagnosis not present

## 2019-07-27 DIAGNOSIS — H353211 Exudative age-related macular degeneration, right eye, with active choroidal neovascularization: Secondary | ICD-10-CM | POA: Diagnosis not present

## 2019-07-27 DIAGNOSIS — H353124 Nonexudative age-related macular degeneration, left eye, advanced atrophic with subfoveal involvement: Secondary | ICD-10-CM | POA: Diagnosis not present

## 2019-07-27 DIAGNOSIS — H35721 Serous detachment of retinal pigment epithelium, right eye: Secondary | ICD-10-CM | POA: Diagnosis not present

## 2019-07-28 DIAGNOSIS — J449 Chronic obstructive pulmonary disease, unspecified: Secondary | ICD-10-CM | POA: Diagnosis not present

## 2019-07-28 DIAGNOSIS — J45909 Unspecified asthma, uncomplicated: Secondary | ICD-10-CM | POA: Diagnosis not present

## 2019-07-28 DIAGNOSIS — E782 Mixed hyperlipidemia: Secondary | ICD-10-CM | POA: Diagnosis not present

## 2019-07-28 DIAGNOSIS — I129 Hypertensive chronic kidney disease with stage 1 through stage 4 chronic kidney disease, or unspecified chronic kidney disease: Secondary | ICD-10-CM | POA: Diagnosis not present

## 2019-07-28 DIAGNOSIS — J410 Simple chronic bronchitis: Secondary | ICD-10-CM | POA: Diagnosis not present

## 2019-07-28 DIAGNOSIS — N183 Chronic kidney disease, stage 3 unspecified: Secondary | ICD-10-CM | POA: Diagnosis not present

## 2019-08-11 DIAGNOSIS — I700 Atherosclerosis of aorta: Secondary | ICD-10-CM | POA: Diagnosis not present

## 2019-08-11 DIAGNOSIS — R0981 Nasal congestion: Secondary | ICD-10-CM | POA: Diagnosis not present

## 2019-08-11 DIAGNOSIS — Z79899 Other long term (current) drug therapy: Secondary | ICD-10-CM | POA: Diagnosis not present

## 2019-08-11 DIAGNOSIS — Z23 Encounter for immunization: Secondary | ICD-10-CM | POA: Diagnosis not present

## 2019-08-11 DIAGNOSIS — Z1389 Encounter for screening for other disorder: Secondary | ICD-10-CM | POA: Diagnosis not present

## 2019-08-11 DIAGNOSIS — I129 Hypertensive chronic kidney disease with stage 1 through stage 4 chronic kidney disease, or unspecified chronic kidney disease: Secondary | ICD-10-CM | POA: Diagnosis not present

## 2019-08-11 DIAGNOSIS — E782 Mixed hyperlipidemia: Secondary | ICD-10-CM | POA: Diagnosis not present

## 2019-08-11 DIAGNOSIS — N1831 Chronic kidney disease, stage 3a: Secondary | ICD-10-CM | POA: Diagnosis not present

## 2019-08-11 DIAGNOSIS — Z0000 Encounter for general adult medical examination without abnormal findings: Secondary | ICD-10-CM | POA: Diagnosis not present

## 2019-08-11 DIAGNOSIS — J449 Chronic obstructive pulmonary disease, unspecified: Secondary | ICD-10-CM | POA: Diagnosis not present

## 2019-08-24 DIAGNOSIS — H353211 Exudative age-related macular degeneration, right eye, with active choroidal neovascularization: Secondary | ICD-10-CM | POA: Diagnosis not present

## 2019-08-24 DIAGNOSIS — H35721 Serous detachment of retinal pigment epithelium, right eye: Secondary | ICD-10-CM | POA: Diagnosis not present

## 2019-08-24 DIAGNOSIS — H353124 Nonexudative age-related macular degeneration, left eye, advanced atrophic with subfoveal involvement: Secondary | ICD-10-CM | POA: Diagnosis not present

## 2019-08-24 DIAGNOSIS — H353113 Nonexudative age-related macular degeneration, right eye, advanced atrophic without subfoveal involvement: Secondary | ICD-10-CM | POA: Diagnosis not present

## 2019-09-07 DIAGNOSIS — E782 Mixed hyperlipidemia: Secondary | ICD-10-CM | POA: Diagnosis not present

## 2019-09-07 DIAGNOSIS — J45909 Unspecified asthma, uncomplicated: Secondary | ICD-10-CM | POA: Diagnosis not present

## 2019-09-07 DIAGNOSIS — N183 Chronic kidney disease, stage 3 unspecified: Secondary | ICD-10-CM | POA: Diagnosis not present

## 2019-09-07 DIAGNOSIS — J410 Simple chronic bronchitis: Secondary | ICD-10-CM | POA: Diagnosis not present

## 2019-09-07 DIAGNOSIS — J449 Chronic obstructive pulmonary disease, unspecified: Secondary | ICD-10-CM | POA: Diagnosis not present

## 2019-09-07 DIAGNOSIS — I129 Hypertensive chronic kidney disease with stage 1 through stage 4 chronic kidney disease, or unspecified chronic kidney disease: Secondary | ICD-10-CM | POA: Diagnosis not present

## 2019-09-08 DIAGNOSIS — I129 Hypertensive chronic kidney disease with stage 1 through stage 4 chronic kidney disease, or unspecified chronic kidney disease: Secondary | ICD-10-CM | POA: Diagnosis not present

## 2019-09-08 DIAGNOSIS — N183 Chronic kidney disease, stage 3 unspecified: Secondary | ICD-10-CM | POA: Diagnosis not present

## 2019-09-21 DIAGNOSIS — J209 Acute bronchitis, unspecified: Secondary | ICD-10-CM | POA: Diagnosis not present

## 2019-09-21 DIAGNOSIS — J9801 Acute bronchospasm: Secondary | ICD-10-CM | POA: Diagnosis not present

## 2019-09-21 DIAGNOSIS — J449 Chronic obstructive pulmonary disease, unspecified: Secondary | ICD-10-CM | POA: Diagnosis not present

## 2019-09-21 DIAGNOSIS — J019 Acute sinusitis, unspecified: Secondary | ICD-10-CM | POA: Diagnosis not present

## 2019-09-25 DIAGNOSIS — H353113 Nonexudative age-related macular degeneration, right eye, advanced atrophic without subfoveal involvement: Secondary | ICD-10-CM | POA: Diagnosis not present

## 2019-09-25 DIAGNOSIS — H35721 Serous detachment of retinal pigment epithelium, right eye: Secondary | ICD-10-CM | POA: Diagnosis not present

## 2019-09-25 DIAGNOSIS — H353211 Exudative age-related macular degeneration, right eye, with active choroidal neovascularization: Secondary | ICD-10-CM | POA: Diagnosis not present

## 2019-09-25 DIAGNOSIS — H353124 Nonexudative age-related macular degeneration, left eye, advanced atrophic with subfoveal involvement: Secondary | ICD-10-CM | POA: Diagnosis not present

## 2019-09-27 DIAGNOSIS — I700 Atherosclerosis of aorta: Secondary | ICD-10-CM | POA: Diagnosis not present

## 2019-09-27 DIAGNOSIS — J449 Chronic obstructive pulmonary disease, unspecified: Secondary | ICD-10-CM | POA: Diagnosis not present

## 2019-09-27 DIAGNOSIS — I129 Hypertensive chronic kidney disease with stage 1 through stage 4 chronic kidney disease, or unspecified chronic kidney disease: Secondary | ICD-10-CM | POA: Diagnosis not present

## 2019-09-27 DIAGNOSIS — G250 Essential tremor: Secondary | ICD-10-CM | POA: Diagnosis not present

## 2019-09-27 DIAGNOSIS — N1831 Chronic kidney disease, stage 3a: Secondary | ICD-10-CM | POA: Diagnosis not present

## 2019-10-04 DIAGNOSIS — Z961 Presence of intraocular lens: Secondary | ICD-10-CM | POA: Diagnosis not present

## 2019-10-04 DIAGNOSIS — H353132 Nonexudative age-related macular degeneration, bilateral, intermediate dry stage: Secondary | ICD-10-CM | POA: Diagnosis not present

## 2019-10-04 DIAGNOSIS — H0102A Squamous blepharitis right eye, upper and lower eyelids: Secondary | ICD-10-CM | POA: Diagnosis not present

## 2019-10-04 DIAGNOSIS — H401132 Primary open-angle glaucoma, bilateral, moderate stage: Secondary | ICD-10-CM | POA: Diagnosis not present

## 2019-10-04 DIAGNOSIS — H0102B Squamous blepharitis left eye, upper and lower eyelids: Secondary | ICD-10-CM | POA: Diagnosis not present

## 2019-10-11 DIAGNOSIS — I129 Hypertensive chronic kidney disease with stage 1 through stage 4 chronic kidney disease, or unspecified chronic kidney disease: Secondary | ICD-10-CM | POA: Diagnosis not present

## 2019-10-11 DIAGNOSIS — J449 Chronic obstructive pulmonary disease, unspecified: Secondary | ICD-10-CM | POA: Diagnosis not present

## 2019-10-11 DIAGNOSIS — J45909 Unspecified asthma, uncomplicated: Secondary | ICD-10-CM | POA: Diagnosis not present

## 2019-10-11 DIAGNOSIS — N183 Chronic kidney disease, stage 3 unspecified: Secondary | ICD-10-CM | POA: Diagnosis not present

## 2019-10-11 DIAGNOSIS — E782 Mixed hyperlipidemia: Secondary | ICD-10-CM | POA: Diagnosis not present

## 2019-10-11 DIAGNOSIS — J410 Simple chronic bronchitis: Secondary | ICD-10-CM | POA: Diagnosis not present

## 2019-10-30 ENCOUNTER — Ambulatory Visit (INDEPENDENT_AMBULATORY_CARE_PROVIDER_SITE_OTHER): Payer: PPO | Admitting: Ophthalmology

## 2019-10-30 ENCOUNTER — Other Ambulatory Visit: Payer: Self-pay

## 2019-10-30 ENCOUNTER — Encounter (INDEPENDENT_AMBULATORY_CARE_PROVIDER_SITE_OTHER): Payer: PPO | Admitting: Ophthalmology

## 2019-10-30 ENCOUNTER — Encounter (INDEPENDENT_AMBULATORY_CARE_PROVIDER_SITE_OTHER): Payer: Self-pay | Admitting: Ophthalmology

## 2019-10-30 DIAGNOSIS — H353124 Nonexudative age-related macular degeneration, left eye, advanced atrophic with subfoveal involvement: Secondary | ICD-10-CM | POA: Diagnosis not present

## 2019-10-30 DIAGNOSIS — H353211 Exudative age-related macular degeneration, right eye, with active choroidal neovascularization: Secondary | ICD-10-CM

## 2019-10-30 DIAGNOSIS — H35721 Serous detachment of retinal pigment epithelium, right eye: Secondary | ICD-10-CM | POA: Diagnosis not present

## 2019-10-30 DIAGNOSIS — H353113 Nonexudative age-related macular degeneration, right eye, advanced atrophic without subfoveal involvement: Secondary | ICD-10-CM | POA: Diagnosis not present

## 2019-10-30 MED ORDER — AFLIBERCEPT 2MG/0.05ML IZ SOLN FOR KALEIDOSCOPE
2.0000 mg | INTRAVITREAL | Status: AC | PRN
  Administered 2019-10-30: 2 mg via INTRAVITREAL

## 2019-10-30 NOTE — Patient Instructions (Signed)

## 2019-10-30 NOTE — Progress Notes (Signed)
10/30/2019     CHIEF COMPLAINT Patient presents for Retina Follow Up   HISTORY OF PRESENT ILLNESS: Robert Doyle is a 86 y.o. male who presents to the clinic today for:   HPI    Retina Follow Up    Patient presents with  Wet AMD.  In right eye.  Severity is moderate.  Since onset it is gradually improving.  I, the attending physician,  performed the HPI with the patient and updated documentation appropriately.          Comments    5 Week AMD f\u OD. Possible Eylea OD> OCT  Pt states NVA has become gradually worse. Pt sees a floater in OD.       Last edited by Tilda Franco on 10/30/2019  3:05 PM. (History)      Referring physician: Lajean Manes, MD [address] [place],  [place] [address]  HISTORICAL INFORMATION:   Selected notes from the MEDICAL RECORD NUMBER       CURRENT MEDICATIONS: Current Outpatient Medications (Ophthalmic Drugs)  Medication Sig  . latanoprost (XALATAN) 0.005 % ophthalmic solution Place 1 drop into both eyes daily.   No current facility-administered medications for this visit. (Ophthalmic Drugs)   Current Outpatient Medications (Other)  Medication Sig  . albuterol (PROAIR HFA) 108 (90 BASE) MCG/ACT inhaler Inhale 2 puffs into the lungs every 4 (four) hours as needed for wheezing or shortness of breath.  Marland Kitchen albuterol (PROVENTIL) (2.5 MG/3ML) 0.083% nebulizer solution Take 2.5 mg by nebulization every 6 (six) hours as needed for wheezing or shortness of breath.  Marland Kitchen albuterol (PROVENTIL) (2.5 MG/3ML) 0.083% nebulizer solution USE 1 VIAL IN NEBULIZER EVERY 6 HOURS AS NEEDED FOR WHEEZING/SHORTNESS OF BREATH  . amLODipine (NORVASC) 10 MG tablet Take 10 mg by mouth daily.  Jearl Klinefelter ELLIPTA 62.5-25 MCG/INH AEPB 1 puff daily.  Marland Kitchen aspirin EC 81 MG tablet Take 81 mg by mouth daily.  Marland Kitchen azithromycin (ZITHROMAX) 250 MG tablet Take 2 tablets today then 1 tablet daily until gone  . Calcium Carb-Cholecalciferol (CALCIUM 600 + D PO) Take 1  tablet by mouth daily.  . Fluticasone Furoate-Vilanterol (BREO ELLIPTA) 100-25 MCG/INH AEPB Inhale 1 puff into the lungs daily.  . hydrochlorothiazide (HYDRODIURIL) 12.5 MG tablet Take 12.5 mg by mouth every morning.  . hydrochlorothiazide (HYDRODIURIL) 25 MG tablet Take 25 mg by mouth every morning.  Marland Kitchen ibuprofen (ADVIL) 800 MG tablet Take 800 mg by mouth 2 (two) times daily.  Marland Kitchen losartan (COZAAR) 50 MG tablet Take 50 mg by mouth daily.   Marland Kitchen lovastatin (MEVACOR) 40 MG tablet Take 40 mg by mouth every morning.   . Multiple Vitamins-Minerals (MULTIVITAMIN PO) Take 1 tablet by mouth daily.  . Multiple Vitamins-Minerals (PRESERVISION AREDS PO) Take 1 tablet by mouth 2 (two) times daily.  . Omega-3 Fatty Acids (FISH OIL) 1200 MG CAPS Take 1,200 mg by mouth daily.   No current facility-administered medications for this visit. (Other)      REVIEW OF SYSTEMS:    ALLERGIES No Known Allergies  PAST MEDICAL HISTORY Past Medical History:  Diagnosis Date  . Allergic rhinitis   . Asthma   . COPD (chronic obstructive pulmonary disease) ([place])   . Hypertension   . Macular degeneration    Past Surgical History:  Procedure Laterality Date  . CATARACT EXTRACTION W/PHACO Left 11/03/2016   Dr. Katy Fitch  . CATARACT EXTRACTION W/PHACO Right 11/22/2016   Dr. Katy Fitch  . COLONOSCOPY WITH PROPOFOL N/A  03/12/2014   Procedure: COLONOSCOPY WITH PROPOFOL;  Surgeon: Garlan Fair, MD;  Location: [facility];  Service: Endoscopy;  Laterality: N/A;  . LUMBAR SPINE SURGERY     x 2 times  . repair deviated septum  40-50 yrs ago  . ROTATOR CUFF REPAIR Left     FAMILY HISTORY Family History  Problem Relation Age of Onset  . Prostate cancer Father   . Macular degeneration Mother     SOCIAL HISTORY Social History   Tobacco Use  . Smoking status: Former Smoker    Packs/day: 2.00    Years: 15.00    Pack years: 30.00    Types: Cigarettes    Quit date: 07/14/1963    Years since quitting: 56.3  .  Smokeless tobacco: Never Used  Substance Use Topics  . Alcohol use: No  . Drug use: No         OPHTHALMIC EXAM:  Base Eye Exam    Visual Acuity (Snellen - Linear)      Right Left   Dist [place] 20/30 -1 20/30 -1       Tonometry (Tonopen, 3:13 PM)      Right Left   Pressure 13 14       Pupils      Pupils Dark Light Shape React APD   Right PERRL 4 3.5 Round Brisk None   Left PERRL 4 3 Round Brisk None       Visual Fields (Counting fingers)      Left Right     Full       Neuro/Psych    Oriented x3: Yes   Mood/Affect: Normal       Dilation    Right eye: 1.0% Mydriacyl, 2.5% Phenylephrine @ 3:13 PM        Slit Lamp and Fundus Exam    Fundus Exam      Right Left   C/D Ratio 0.3           IMAGING AND PROCEDURES  Imaging and Procedures for 10/30/19  OCT, Retina - OU - Both Eyes       Right Eye Quality was good. Scan locations included subfoveal. Central Foveal Thickness: 308. Progression has been stable. Findings include abnormal foveal contour, choroidal neovascular membrane, cystoid macular edema.   Left Eye Quality was good. Scan locations included subfoveal. Findings include normal observations, retinal drusen .   Notes OD with minimal subretinal fluid and a chronic active region superior to the foveal avascular zone, controlled on Eylea yet not resolving, currently       Intravitreal Injection, Pharmacologic Agent - OD - Right Eye       Time Out 10/30/2019. 4:22 PM. Confirmed correct patient, procedure, site, and patient consented.   Anesthesia Topical anesthesia was used. Anesthetic medications included Akten 3.5%.   Procedure Preparation included 10% betadine to eyelids. A 30 gauge needle was used.   Injection:  2 mg aflibercept Alfonse Flavors) SOLN   NDC: [id number], Lot: [id number]   Route: Intravitreal, Site: Right Eye, Waste: 0 mg  Post-op Post injection exam found visual acuity of at least counting fingers. The patient tolerated the  procedure well. There were no complications. The patient received written and verbal post procedure care education. Post injection medications were not given.                 ASSESSMENT/PLAN:  No problem-specific Assessment & Plan notes found for this encounter.      ICD-10-CM   1. Exudative age-related  macular degeneration of right eye with active choroidal neovascularization (HCC)  H35.3211 OCT, Retina - OU - Both Eyes    Intravitreal Injection, Pharmacologic Agent - OD - Right Eye    aflibercept (EYLEA) SOLN 2 mg  2. Advanced nonexudative age-related macular degeneration of left eye with subfoveal involvement  H35.3124 OCT, Retina - OU - Both Eyes  3. Serous detachment of retinal pigment epithelium of right eye  H35.721   4. Advanced nonexudative age-related macular degeneration of right eye without subfoveal involvement  H35.3113     1.  Return visit in 5 weeks to monitor the findings in the right eye and possible injection Eylea  2.  3.  Ophthalmic Meds Ordered this visit:  Meds ordered this encounter  Medications  . aflibercept (EYLEA) SOLN 2 mg       Return in about 5 weeks (around 12/04/2019) for EYLEA OCT, OS.  Patient Instructions  The nature of wet macular degeneration was discussed with the patient.  Forms of therapy reviewed include the use of Anti-VEGF medications injected painlessly into the eye, as well as other possible treatment modalities, including thermal laser therapy. Fellow eye involvement and risks were discussed with the patient. Upon the finding of wet age related macular degeneration, treatment will be offered. The treatment regimen is on a treat as needed basis with the intent to treat if necessary and extend interval of exams when possible. On average 1 out of 6 patients do not need lifetime therapy. However, the risk of recurrent disease is high for a lifetime.  Initially monthly, then periodic, examinations and evaluations will determine whether  the next treatment is required on the day of the examination.    Explained the diagnoses, plan, and follow up with the patient and they expressed understanding.  Patient expressed understanding of the importance of proper follow up care.   Clent Demark Bless Lisenby M.D. Diseases & Surgery of the Retina and Vitreous Retina & Diabetic [place] 10/30/19     Abbreviations: M myopia (nearsighted); A astigmatism; H hyperopia (farsighted); P presbyopia; Mrx spectacle prescription;  CTL contact lenses; OD right eye; OS left eye; OU both eyes  XT exotropia; ET esotropia; PEK punctate epithelial keratitis; PEE punctate epithelial erosions; DES dry eye syndrome; MGD meibomian gland dysfunction; ATs artificial tears; PFAT's preservative free artificial tears; [place] nuclear sclerotic cataract; PSC posterior subcapsular cataract; ERM epi-retinal membrane; PVD posterior vitreous detachment; RD retinal detachment; DM diabetes mellitus; DR diabetic retinopathy; NPDR non-proliferative diabetic retinopathy; PDR proliferative diabetic retinopathy; CSME clinically significant macular edema; DME diabetic macular edema; dbh dot blot hemorrhages; CWS cotton wool spot; POAG primary open angle glaucoma; C/D cup-to-disc ratio; HVF humphrey visual field; GVF goldmann visual field; OCT optical coherence tomography; IOP intraocular pressure; BRVO Branch retinal vein occlusion; CRVO central retinal vein occlusion; CRAO central retinal artery occlusion; BRAO branch retinal artery occlusion; RT retinal tear; SB scleral buckle; PPV pars plana vitrectomy; VH Vitreous hemorrhage; PRP panretinal laser photocoagulation; IVK intravitreal kenalog; VMT vitreomacular traction; MH Macular hole;  NVD neovascularization of the disc; NVE neovascularization elsewhere; AREDS age related eye disease study; ARMD age related macular degeneration; POAG primary open angle glaucoma; EBMD epithelial/anterior basement membrane dystrophy; ACIOL anterior chamber  intraocular lens; IOL intraocular lens; PCIOL posterior chamber intraocular lens; Phaco/IOL phacoemulsification with intraocular lens placement; [place] photorefractive keratectomy; LASIK laser assisted in situ keratomileusis; HTN hypertension; DM diabetes mellitus; COPD chronic obstructive pulmonary disease

## 2019-11-10 DIAGNOSIS — N183 Chronic kidney disease, stage 3 unspecified: Secondary | ICD-10-CM | POA: Diagnosis not present

## 2019-11-10 DIAGNOSIS — I129 Hypertensive chronic kidney disease with stage 1 through stage 4 chronic kidney disease, or unspecified chronic kidney disease: Secondary | ICD-10-CM | POA: Diagnosis not present

## 2019-12-01 DIAGNOSIS — J45909 Unspecified asthma, uncomplicated: Secondary | ICD-10-CM | POA: Diagnosis not present

## 2019-12-01 DIAGNOSIS — N1831 Chronic kidney disease, stage 3a: Secondary | ICD-10-CM | POA: Diagnosis not present

## 2019-12-01 DIAGNOSIS — I129 Hypertensive chronic kidney disease with stage 1 through stage 4 chronic kidney disease, or unspecified chronic kidney disease: Secondary | ICD-10-CM | POA: Diagnosis not present

## 2019-12-01 DIAGNOSIS — J449 Chronic obstructive pulmonary disease, unspecified: Secondary | ICD-10-CM | POA: Diagnosis not present

## 2019-12-01 DIAGNOSIS — E782 Mixed hyperlipidemia: Secondary | ICD-10-CM | POA: Diagnosis not present

## 2019-12-04 ENCOUNTER — Other Ambulatory Visit: Payer: Self-pay

## 2019-12-04 ENCOUNTER — Encounter (INDEPENDENT_AMBULATORY_CARE_PROVIDER_SITE_OTHER): Payer: Self-pay | Admitting: Ophthalmology

## 2019-12-04 ENCOUNTER — Ambulatory Visit (INDEPENDENT_AMBULATORY_CARE_PROVIDER_SITE_OTHER): Payer: PPO | Admitting: Ophthalmology

## 2019-12-04 DIAGNOSIS — H353211 Exudative age-related macular degeneration, right eye, with active choroidal neovascularization: Secondary | ICD-10-CM

## 2019-12-04 MED ORDER — AFLIBERCEPT 2MG/0.05ML IZ SOLN FOR KALEIDOSCOPE
2.0000 mg | INTRAVITREAL | Status: AC | PRN
Start: 2019-12-04 — End: 2019-12-04
  Administered 2019-12-04: 2 mg via INTRAVITREAL

## 2019-12-04 NOTE — Progress Notes (Signed)
12/04/2019     CHIEF COMPLAINT Patient presents for Retina Follow Up   HISTORY OF PRESENT ILLNESS: Robert Doyle is a 86 y.o. male who presents to the clinic today for:   HPI    Retina Follow Up    Patient presents with  Wet AMD.  In right eye.  Duration of 5 weeks.  Since onset it is stable.          Comments    5 week follow up - OCT OU, Possible Eylea OD Patient denies change in vision and overall has no complaints except for when he looks to the right, sometimes he has double vision.        Last edited by Gerda Diss on 12/04/2019  1:04 PM. (History)      Referring physician: Lajean Manes, MD [address] [place],  [place] [address]  HISTORICAL INFORMATION:   Selected notes from the MEDICAL RECORD NUMBER       CURRENT MEDICATIONS: Current Outpatient Medications (Ophthalmic Drugs)  Medication Sig  . latanoprost (XALATAN) 0.005 % ophthalmic solution Place 1 drop into both eyes daily.   No current facility-administered medications for this visit. (Ophthalmic Drugs)   Current Outpatient Medications (Other)  Medication Sig  . albuterol (PROAIR HFA) 108 (90 BASE) MCG/ACT inhaler Inhale 2 puffs into the lungs every 4 (four) hours as needed for wheezing or shortness of breath.  Marland Kitchen albuterol (PROVENTIL) (2.5 MG/3ML) 0.083% nebulizer solution Take 2.5 mg by nebulization every 6 (six) hours as needed for wheezing or shortness of breath.  Marland Kitchen albuterol (PROVENTIL) (2.5 MG/3ML) 0.083% nebulizer solution USE 1 VIAL IN NEBULIZER EVERY 6 HOURS AS NEEDED FOR WHEEZING/SHORTNESS OF BREATH  . amLODipine (NORVASC) 10 MG tablet Take 10 mg by mouth daily.  Jearl Klinefelter ELLIPTA 62.5-25 MCG/INH AEPB 1 puff daily.  Marland Kitchen aspirin EC 81 MG tablet Take 81 mg by mouth daily.  Marland Kitchen azithromycin (ZITHROMAX) 250 MG tablet Take 2 tablets today then 1 tablet daily until gone  . Calcium Carb-Cholecalciferol (CALCIUM 600 + D PO) Take 1 tablet by mouth daily.  . Fluticasone Furoate-Vilanterol  (BREO ELLIPTA) 100-25 MCG/INH AEPB Inhale 1 puff into the lungs daily.  . hydrochlorothiazide (HYDRODIURIL) 12.5 MG tablet Take 12.5 mg by mouth every morning.  . hydrochlorothiazide (HYDRODIURIL) 25 MG tablet Take 25 mg by mouth every morning.  Marland Kitchen ibuprofen (ADVIL) 800 MG tablet Take 800 mg by mouth 2 (two) times daily.  Marland Kitchen losartan (COZAAR) 50 MG tablet Take 50 mg by mouth daily.   Marland Kitchen lovastatin (MEVACOR) 40 MG tablet Take 40 mg by mouth every morning.   . Multiple Vitamins-Minerals (MULTIVITAMIN PO) Take 1 tablet by mouth daily.  . Multiple Vitamins-Minerals (PRESERVISION AREDS PO) Take 1 tablet by mouth 2 (two) times daily.  . Omega-3 Fatty Acids (FISH OIL) 1200 MG CAPS Take 1,200 mg by mouth daily.   No current facility-administered medications for this visit. (Other)      REVIEW OF SYSTEMS:    ALLERGIES No Known Allergies  PAST MEDICAL HISTORY Past Medical History:  Diagnosis Date  . Allergic rhinitis   . Asthma   . COPD (chronic obstructive pulmonary disease) ([place])   . Hypertension   . Macular degeneration    Past Surgical History:  Procedure Laterality Date  . CATARACT EXTRACTION W/PHACO Left 11/03/2016   Dr. Katy Fitch  . CATARACT EXTRACTION W/PHACO Right 11/22/2016   Dr. Katy Fitch  . COLONOSCOPY WITH PROPOFOL N/A 03/12/2014   Procedure: COLONOSCOPY WITH  PROPOFOL;  Surgeon: Garlan Fair, MD;  Location: [facility];  Service: Endoscopy;  Laterality: N/A;  . LUMBAR SPINE SURGERY     x 2 times  . repair deviated septum  40-50 yrs ago  . ROTATOR CUFF REPAIR Left     FAMILY HISTORY Family History  Problem Relation Age of Onset  . Prostate cancer Father   . Macular degeneration Mother     SOCIAL HISTORY Social History   Tobacco Use  . Smoking status: Former Smoker    Packs/day: 2.00    Years: 15.00    Pack years: 30.00    Types: Cigarettes    Quit date: 07/14/1963    Years since quitting: 56.4  . Smokeless tobacco: Never Used  Substance Use Topics  . Alcohol  use: No  . Drug use: No         OPHTHALMIC EXAM:  Base Eye Exam    Visual Acuity (Snellen - Linear)      Right Left   Dist [place] 20/25-2 20/25-2       Tonometry (Tonopen, 1:06 PM)      Right Left   Pressure 11 11       Pupils      Pupils Dark Light Shape React APD   Right PERRL 4 3 Round Slow None   Left PERRL 4 3 Round Slow None       Visual Fields (Counting fingers)      Left Right    Full Full       Extraocular Movement      Right Left    Full Full       Neuro/Psych    Oriented x3: Yes   Mood/Affect: Normal       Dilation    Right eye: 1.0% Mydriacyl, 2.5% Phenylephrine @ 1:06 PM        Slit Lamp and Fundus Exam    External Exam      Right Left   External Normal Normal       Slit Lamp Exam      Right Left   Lids/Lashes Normal Normal   Conjunctiva/Sclera White and quiet White and quiet   Cornea Clear Clear   Anterior Chamber Deep and quiet Deep and quiet   Iris Round and reactive Round and reactive   Lens Posterior chamber intraocular lens, centered Posterior chamber intraocular lens   Anterior Vitreous Normal Normal       Fundus Exam      Right Left   Posterior Vitreous Posterior vitreous detachment    Disc Normal    C/D Ratio 0.35    Macula Retinal pigment epithelial mottling, no macular thickening, no hemorrhage, no exudates    Vessels Normal    Periphery Normal           IMAGING AND PROCEDURES  Imaging and Procedures for 12/04/19  OCT, Retina - OU - Both Eyes       Right Eye Quality was good. Scan locations included subfoveal. Central Foveal Thickness: 291. Progression has improved. Findings include abnormal foveal contour, retinal drusen , subretinal fluid.   Left Eye Quality was good. Scan locations included subfoveal. Central Foveal Thickness: 273. Progression has been stable. Findings include abnormal foveal contour, retinal drusen .   Notes OD, region of subretinal fluid and thickening superior to the fovea has indeed  improved on intra-vitreal Eylea, will repeat OD today and examination in 5 weeks       Intravitreal Injection, Pharmacologic Agent - OD -  Right Eye       Time Out 12/04/2019. 2:05 PM. Confirmed correct patient, procedure, site, and patient consented.   Anesthesia Topical anesthesia was used. Anesthetic medications included Akten 3.5%.   Procedure Preparation included Ofloxacin , 10% betadine to eyelids. A 30 gauge needle was used.   Injection:  2 mg aflibercept Robert Doyle) SOLN   NDC: [id number], Lot: [id number]   Route: Intravitreal, Site: Right Eye, Waste: 0 mg  Post-op Post injection exam found visual acuity of at least counting fingers. The patient tolerated the procedure well. There were no complications. The patient received written and verbal post procedure care education.                 ASSESSMENT/PLAN:  Exudative age-related macular degeneration of right eye with active choroidal neovascularization (HCC) The nature of wet macular degeneration was discussed with the patient.  Forms of therapy reviewed include the use of Anti-VEGF medications injected painlessly into the eye, as well as other possible treatment modalities, including thermal laser therapy. Fellow eye involvement and risks were discussed with the patient. Upon the finding of wet age related macular degeneration, treatment will be offered. The treatment regimen is on a treat as needed basis with the intent to treat if necessary and extend interval of exams when possible. On average 1 out of 6 patients do not need lifetime therapy. However, the risk of recurrent disease is high for a lifetime.  Initially monthly, then periodic, examinations and evaluations will determine whether the next treatment is required on the day of the examination.  Improved, less subretinal fluid on intravitreal Eylea, repeat today repeat examination in 5 weeks      ICD-10-CM   1. Exudative age-related macular degeneration of right  eye with active choroidal neovascularization (HCC)  H35.3211 OCT, Retina - OU - Both Eyes    Intravitreal Injection, Pharmacologic Agent - OD - Right Eye    aflibercept (EYLEA) SOLN 2 mg    1.OD, region of subretinal fluid and thickening superior to the fovea has indeed improved on intra-vitreal Eylea, will repeat OD today and examination in 5 weeks  2.  3.  Ophthalmic Meds Ordered this visit:  Meds ordered this encounter  Medications  . aflibercept (EYLEA) SOLN 2 mg       Return in about 5 weeks (around 01/08/2020) for EYLEA OCT, OD.  There are no Patient Instructions on file for this visit.   Explained the diagnoses, plan, and follow up with the patient and they expressed understanding.  Patient expressed understanding of the importance of proper follow up care.   Clent Demark Camielle Sizer M.D. Diseases & Surgery of the Retina and Vitreous Retina & Diabetic [place] 12/04/19     Abbreviations: M myopia (nearsighted); A astigmatism; H hyperopia (farsighted); P presbyopia; Mrx spectacle prescription;  CTL contact lenses; OD right eye; OS left eye; OU both eyes  XT exotropia; ET esotropia; PEK punctate epithelial keratitis; PEE punctate epithelial erosions; DES dry eye syndrome; MGD meibomian gland dysfunction; ATs artificial tears; PFAT's preservative free artificial tears; [place] nuclear sclerotic cataract; PSC posterior subcapsular cataract; ERM epi-retinal membrane; PVD posterior vitreous detachment; RD retinal detachment; DM diabetes mellitus; DR diabetic retinopathy; NPDR non-proliferative diabetic retinopathy; PDR proliferative diabetic retinopathy; CSME clinically significant macular edema; DME diabetic macular edema; dbh dot blot hemorrhages; CWS cotton wool spot; POAG primary open angle glaucoma; C/D cup-to-disc ratio; HVF humphrey visual field; GVF goldmann visual field; OCT optical coherence tomography; IOP intraocular pressure; BRVO Branch retinal vein occlusion;  CRVO central  retinal vein occlusion; CRAO central retinal artery occlusion; BRAO branch retinal artery occlusion; RT retinal tear; SB scleral buckle; PPV pars plana vitrectomy; VH Vitreous hemorrhage; PRP panretinal laser photocoagulation; IVK intravitreal kenalog; VMT vitreomacular traction; MH Macular hole;  NVD neovascularization of the disc; NVE neovascularization elsewhere; AREDS age related eye disease study; ARMD age related macular degeneration; POAG primary open angle glaucoma; EBMD epithelial/anterior basement membrane dystrophy; ACIOL anterior chamber intraocular lens; IOL intraocular lens; PCIOL posterior chamber intraocular lens; Phaco/IOL phacoemulsification with intraocular lens placement; [place] photorefractive keratectomy; LASIK laser assisted in situ keratomileusis; HTN hypertension; DM diabetes mellitus; COPD chronic obstructive pulmonary disease

## 2019-12-04 NOTE — Assessment & Plan Note (Signed)
The nature of wet macular degeneration was discussed with the patient.  Forms of therapy reviewed include the use of Anti-VEGF medications injected painlessly into the eye, as well as other possible treatment modalities, including thermal laser therapy. Fellow eye involvement and risks were discussed with the patient. Upon the finding of wet age related macular degeneration, treatment will be offered. The treatment regimen is on a treat as needed basis with the intent to treat if necessary and extend interval of exams when possible. On average 1 out of 6 patients do not need lifetime therapy. However, the risk of recurrent disease is high for a lifetime.  Initially monthly, then periodic, examinations and evaluations will determine whether the next treatment is required on the day of the examination.  Improved, less subretinal fluid on intravitreal Eylea, repeat today repeat examination in 5 weeks

## 2019-12-08 DIAGNOSIS — N183 Chronic kidney disease, stage 3 unspecified: Secondary | ICD-10-CM | POA: Diagnosis not present

## 2019-12-08 DIAGNOSIS — I129 Hypertensive chronic kidney disease with stage 1 through stage 4 chronic kidney disease, or unspecified chronic kidney disease: Secondary | ICD-10-CM | POA: Diagnosis not present

## 2019-12-13 ENCOUNTER — Other Ambulatory Visit (HOSPITAL_COMMUNITY): Payer: Self-pay | Admitting: Geriatric Medicine

## 2019-12-13 DIAGNOSIS — M79604 Pain in right leg: Secondary | ICD-10-CM

## 2019-12-13 DIAGNOSIS — N1831 Chronic kidney disease, stage 3a: Secondary | ICD-10-CM | POA: Diagnosis not present

## 2019-12-13 DIAGNOSIS — I129 Hypertensive chronic kidney disease with stage 1 through stage 4 chronic kidney disease, or unspecified chronic kidney disease: Secondary | ICD-10-CM | POA: Diagnosis not present

## 2019-12-13 DIAGNOSIS — R600 Localized edema: Secondary | ICD-10-CM | POA: Diagnosis not present

## 2019-12-13 DIAGNOSIS — G250 Essential tremor: Secondary | ICD-10-CM | POA: Diagnosis not present

## 2019-12-14 ENCOUNTER — Other Ambulatory Visit: Payer: Self-pay

## 2019-12-14 ENCOUNTER — Ambulatory Visit (HOSPITAL_COMMUNITY)
Admission: RE | Admit: 2019-12-14 | Discharge: 2019-12-14 | Disposition: A | Discharge status: Home / Self Care | Payer: PPO | Source: Ambulatory Visit | Attending: Geriatric Medicine | Admitting: Geriatric Medicine

## 2019-12-14 DIAGNOSIS — M7989 Other specified soft tissue disorders: Secondary | ICD-10-CM

## 2019-12-14 DIAGNOSIS — M79604 Pain in right leg: Secondary | ICD-10-CM | POA: Insufficient documentation

## 2019-12-14 NOTE — Progress Notes (Signed)
Venous duplex       has been completed. Preliminary results can be found under CV proc through chart review. June Leap, BS, RDMS, RVT    Called results to [facility]

## 2019-12-22 DIAGNOSIS — I129 Hypertensive chronic kidney disease with stage 1 through stage 4 chronic kidney disease, or unspecified chronic kidney disease: Secondary | ICD-10-CM | POA: Diagnosis not present

## 2019-12-22 DIAGNOSIS — J45909 Unspecified asthma, uncomplicated: Secondary | ICD-10-CM | POA: Diagnosis not present

## 2019-12-22 DIAGNOSIS — J449 Chronic obstructive pulmonary disease, unspecified: Secondary | ICD-10-CM | POA: Diagnosis not present

## 2019-12-22 DIAGNOSIS — E782 Mixed hyperlipidemia: Secondary | ICD-10-CM | POA: Diagnosis not present

## 2019-12-22 DIAGNOSIS — N1831 Chronic kidney disease, stage 3a: Secondary | ICD-10-CM | POA: Diagnosis not present

## 2019-12-25 DIAGNOSIS — Z79899 Other long term (current) drug therapy: Secondary | ICD-10-CM | POA: Diagnosis not present

## 2020-01-05 DIAGNOSIS — G250 Essential tremor: Secondary | ICD-10-CM | POA: Diagnosis not present

## 2020-01-05 DIAGNOSIS — Z79899 Other long term (current) drug therapy: Secondary | ICD-10-CM | POA: Diagnosis not present

## 2020-01-08 ENCOUNTER — Ambulatory Visit (INDEPENDENT_AMBULATORY_CARE_PROVIDER_SITE_OTHER): Payer: PPO | Admitting: Ophthalmology

## 2020-01-08 ENCOUNTER — Other Ambulatory Visit: Payer: Self-pay

## 2020-01-08 ENCOUNTER — Encounter (INDEPENDENT_AMBULATORY_CARE_PROVIDER_SITE_OTHER): Payer: Self-pay | Admitting: Ophthalmology

## 2020-01-08 DIAGNOSIS — H353211 Exudative age-related macular degeneration, right eye, with active choroidal neovascularization: Secondary | ICD-10-CM | POA: Diagnosis not present

## 2020-01-08 MED ORDER — AFLIBERCEPT 2MG/0.05ML IZ SOLN FOR KALEIDOSCOPE
2.0000 mg | INTRAVITREAL | Status: AC | PRN
  Administered 2020-01-08: 2 mg via INTRAVITREAL

## 2020-01-08 NOTE — Progress Notes (Signed)
01/08/2020     CHIEF COMPLAINT Patient presents for Retina Follow Up   HISTORY OF PRESENT ILLNESS: Robert Doyle is a 86 y.o. male who presents to the clinic today for:   HPI    Retina Follow Up    Patient presents with  Wet AMD.  In right eye.  Duration of 5 weeks.  Since onset it is stable.          Comments    5 week f.u - OCT OU, Poss Eylea OD Patient denies change in vision and overall has no complaints.        Last edited by Gerda Diss on 01/08/2020  1:12 PM. (History)      Referring physician: Lajean Manes, MD [address] [place],  [place] [address]  HISTORICAL INFORMATION:   Selected notes from the MEDICAL RECORD NUMBER       CURRENT MEDICATIONS: Current Outpatient Medications (Ophthalmic Drugs)  Medication Sig  . latanoprost (XALATAN) 0.005 % ophthalmic solution Place 1 drop into both eyes daily.   No current facility-administered medications for this visit. (Ophthalmic Drugs)   Current Outpatient Medications (Other)  Medication Sig  . albuterol (PROAIR HFA) 108 (90 BASE) MCG/ACT inhaler Inhale 2 puffs into the lungs every 4 (four) hours as needed for wheezing or shortness of breath.  Marland Kitchen albuterol (PROVENTIL) (2.5 MG/3ML) 0.083% nebulizer solution Take 2.5 mg by nebulization every 6 (six) hours as needed for wheezing or shortness of breath.  Marland Kitchen albuterol (PROVENTIL) (2.5 MG/3ML) 0.083% nebulizer solution USE 1 VIAL IN NEBULIZER EVERY 6 HOURS AS NEEDED FOR WHEEZING/SHORTNESS OF BREATH  . amLODipine (NORVASC) 10 MG tablet Take 10 mg by mouth daily.  Jearl Klinefelter ELLIPTA 62.5-25 MCG/INH AEPB 1 puff daily.  Marland Kitchen aspirin EC 81 MG tablet Take 81 mg by mouth daily.  Marland Kitchen azithromycin (ZITHROMAX) 250 MG tablet Take 2 tablets today then 1 tablet daily until gone  . Calcium Carb-Cholecalciferol (CALCIUM 600 + D PO) Take 1 tablet by mouth daily.  . Fluticasone Furoate-Vilanterol (BREO ELLIPTA) 100-25 MCG/INH AEPB Inhale 1 puff into the lungs daily.  .  hydrochlorothiazide (HYDRODIURIL) 12.5 MG tablet Take 12.5 mg by mouth every morning.  . hydrochlorothiazide (HYDRODIURIL) 25 MG tablet Take 25 mg by mouth every morning.  Marland Kitchen ibuprofen (ADVIL) 800 MG tablet Take 800 mg by mouth 2 (two) times daily.  Marland Kitchen losartan (COZAAR) 50 MG tablet Take 50 mg by mouth daily.   Marland Kitchen lovastatin (MEVACOR) 40 MG tablet Take 40 mg by mouth every morning.   . Multiple Vitamins-Minerals (MULTIVITAMIN PO) Take 1 tablet by mouth daily.  . Multiple Vitamins-Minerals (PRESERVISION AREDS PO) Take 1 tablet by mouth 2 (two) times daily.  . Omega-3 Fatty Acids (FISH OIL) 1200 MG CAPS Take 1,200 mg by mouth daily.   No current facility-administered medications for this visit. (Other)      REVIEW OF SYSTEMS:    ALLERGIES No Known Allergies  PAST MEDICAL HISTORY Past Medical History:  Diagnosis Date  . Allergic rhinitis   . Asthma   . COPD (chronic obstructive pulmonary disease) ([place])   . Hypertension   . Macular degeneration    Past Surgical History:  Procedure Laterality Date  . CATARACT EXTRACTION W/PHACO Left 11/03/2016   Dr. Katy Fitch  . CATARACT EXTRACTION W/PHACO Right 11/22/2016   Dr. Katy Fitch  . COLONOSCOPY WITH PROPOFOL N/A 03/12/2014   Procedure: COLONOSCOPY WITH PROPOFOL;  Surgeon: Garlan Fair, MD;  Location: [facility];  Service: Endoscopy;  Laterality: N/A;  . LUMBAR SPINE SURGERY     x 2 times  . repair deviated septum  40-50 yrs ago  . ROTATOR CUFF REPAIR Left     FAMILY HISTORY Family History  Problem Relation Age of Onset  . Prostate cancer Father   . Macular degeneration Mother     SOCIAL HISTORY Social History   Tobacco Use  . Smoking status: Former Smoker    Packs/day: 2.00    Years: 15.00    Pack years: 30.00    Types: Cigarettes    Quit date: 07/14/1963    Years since quitting: 56.5  . Smokeless tobacco: Never Used  Substance Use Topics  . Alcohol use: No  . Drug use: No         OPHTHALMIC EXAM:  Base Eye Exam      Visual Acuity (Snellen - Linear)      Right Left   Dist [place] 20/25-2 20/25-2       Tonometry (Tonopen, 1:18 PM)      Right Left   Pressure 6 6       Pupils      Pupils Dark Light Shape React APD   Right PERRL 4 3 Round Slow None   Left PERRL 4 3 Round Slow None       Visual Fields (Counting fingers)      Left Right    Full Full       Extraocular Movement      Right Left    Full Full       Neuro/Psych    Oriented x3: Yes   Mood/Affect: Normal       Dilation    Right eye: 1.0% Mydriacyl, 2.5% Phenylephrine @ 1:19 PM        Slit Lamp and Fundus Exam    External Exam      Right Left   External Normal Normal       Slit Lamp Exam      Right Left   Lids/Lashes Normal Normal   Conjunctiva/Sclera White and quiet White and quiet   Cornea Clear Clear   Anterior Chamber Deep and quiet Deep and quiet   Iris Round and reactive Round and reactive   Lens Posterior chamber intraocular lens Posterior chamber intraocular lens   Anterior Vitreous Normal Normal       Fundus Exam      Right Left   Posterior Vitreous Posterior vitreous detachment    Disc Normal    C/D Ratio 0.35    Macula Retinal pigment epithelial mottling, no macular thickening, no hemorrhage, no exudates    Vessels Normal    Periphery Normal           IMAGING AND PROCEDURES  Imaging and Procedures for 01/08/20  OCT, Retina - OU - Both Eyes       Right Eye Quality was good. Scan locations included subfoveal. Central Foveal Thickness: 297. Progression has improved. Findings include no IRF, no SRF.   Left Eye Quality was good. Scan locations included subfoveal. Central Foveal Thickness: 271. Progression has been stable. Findings include no SRF, no IRF.   Notes OD with less active subretinal fluid deposition to lesion superior to the fovea, now on intravitreal Eylea, now at 5-week interval, will extend interval to 6 to 7 weeks of exam       Intravitreal Injection, Pharmacologic Agent - OD  - Right Eye       Time Out 01/08/2020. 2:03 PM. Confirmed correct  patient, procedure, site, and patient consented.   Anesthesia Topical anesthesia was used. Anesthetic medications included Akten 3.5%.   Procedure Preparation included 10% betadine to eyelids, Tobramycin 0.3%. A 30 gauge needle was used.   Injection:  2 mg aflibercept Alfonse Flavors) SOLN   NDC: [id number], Lot: [id number]   Route: Intravitreal, Site: Right Eye, Waste: 0 mg  Post-op Post injection exam found visual acuity of at least counting fingers. The patient tolerated the procedure well. There were no complications. The patient received written and verbal post procedure care education. Post injection medications were not given.                 ASSESSMENT/PLAN:  Exudative age-related macular degeneration of right eye with active choroidal neovascularization (HCC) Improved macular findings OD, much less subretinal fluid, symptomatically much improved as well per patient.  Post Eylea injection OD currently at 5-week interval exam, will repeat injection today and in 6 weeks evaluate      ICD-10-CM   1. Exudative age-related macular degeneration of right eye with active choroidal neovascularization (HCC)  H35.3211 OCT, Retina - OU - Both Eyes    Intravitreal Injection, Pharmacologic Agent - OD - Right Eye    aflibercept (EYLEA) SOLN 2 mg    1.  Wet ARMD, CNVM OD, much less active now that patient receiving intravitreal Eylea.  Currently at 5-week interval exam  2.  3.  Ophthalmic Meds Ordered this visit:  Meds ordered this encounter  Medications  . aflibercept (EYLEA) SOLN 2 mg       Return in about 6 weeks (around 02/19/2020) for OD, EYLEA OCT.  There are no Patient Instructions on file for this visit.   Explained the diagnoses, plan, and follow up with the patient and they expressed understanding.  Patient expressed understanding of the importance of proper follow up care.   Clent Demark Avram Danielson  M.D. Diseases & Surgery of the Retina and Vitreous Retina & Diabetic [place] 01/08/20     Abbreviations: M myopia (nearsighted); A astigmatism; H hyperopia (farsighted); P presbyopia; Mrx spectacle prescription;  CTL contact lenses; OD right eye; OS left eye; OU both eyes  XT exotropia; ET esotropia; PEK punctate epithelial keratitis; PEE punctate epithelial erosions; DES dry eye syndrome; MGD meibomian gland dysfunction; ATs artificial tears; PFAT's preservative free artificial tears; [place] nuclear sclerotic cataract; PSC posterior subcapsular cataract; ERM epi-retinal membrane; PVD posterior vitreous detachment; RD retinal detachment; DM diabetes mellitus; DR diabetic retinopathy; NPDR non-proliferative diabetic retinopathy; PDR proliferative diabetic retinopathy; CSME clinically significant macular edema; DME diabetic macular edema; dbh dot blot hemorrhages; CWS cotton wool spot; POAG primary open angle glaucoma; C/D cup-to-disc ratio; HVF humphrey visual field; GVF goldmann visual field; OCT optical coherence tomography; IOP intraocular pressure; BRVO Branch retinal vein occlusion; CRVO central retinal vein occlusion; CRAO central retinal artery occlusion; BRAO branch retinal artery occlusion; RT retinal tear; SB scleral buckle; PPV pars plana vitrectomy; VH Vitreous hemorrhage; PRP panretinal laser photocoagulation; IVK intravitreal kenalog; VMT vitreomacular traction; MH Macular hole;  NVD neovascularization of the disc; NVE neovascularization elsewhere; AREDS age related eye disease study; ARMD age related macular degeneration; POAG primary open angle glaucoma; EBMD epithelial/anterior basement membrane dystrophy; ACIOL anterior chamber intraocular lens; IOL intraocular lens; PCIOL posterior chamber intraocular lens; Phaco/IOL phacoemulsification with intraocular lens placement; [place] photorefractive keratectomy; LASIK laser assisted in situ keratomileusis; HTN hypertension; DM diabetes mellitus; COPD  chronic obstructive pulmonary disease

## 2020-01-08 NOTE — Assessment & Plan Note (Signed)
Improved macular findings OD, much less subretinal fluid, symptomatically much improved as well per patient.  Post Eylea injection OD currently at 5-week interval exam, will repeat injection today and in 6 weeks evaluate

## 2020-01-10 DIAGNOSIS — I129 Hypertensive chronic kidney disease with stage 1 through stage 4 chronic kidney disease, or unspecified chronic kidney disease: Secondary | ICD-10-CM | POA: Diagnosis not present

## 2020-01-10 DIAGNOSIS — N1831 Chronic kidney disease, stage 3a: Secondary | ICD-10-CM | POA: Diagnosis not present

## 2020-02-02 DIAGNOSIS — Z79899 Other long term (current) drug therapy: Secondary | ICD-10-CM | POA: Diagnosis not present

## 2020-02-10 DIAGNOSIS — I129 Hypertensive chronic kidney disease with stage 1 through stage 4 chronic kidney disease, or unspecified chronic kidney disease: Secondary | ICD-10-CM | POA: Diagnosis not present

## 2020-02-19 ENCOUNTER — Encounter (INDEPENDENT_AMBULATORY_CARE_PROVIDER_SITE_OTHER): Payer: Self-pay | Admitting: Ophthalmology

## 2020-02-19 ENCOUNTER — Other Ambulatory Visit: Payer: Self-pay

## 2020-02-19 ENCOUNTER — Ambulatory Visit (INDEPENDENT_AMBULATORY_CARE_PROVIDER_SITE_OTHER): Payer: PPO | Admitting: Ophthalmology

## 2020-02-19 DIAGNOSIS — H353124 Nonexudative age-related macular degeneration, left eye, advanced atrophic with subfoveal involvement: Secondary | ICD-10-CM

## 2020-02-19 DIAGNOSIS — H353211 Exudative age-related macular degeneration, right eye, with active choroidal neovascularization: Secondary | ICD-10-CM | POA: Diagnosis not present

## 2020-02-19 DIAGNOSIS — H353113 Nonexudative age-related macular degeneration, right eye, advanced atrophic without subfoveal involvement: Secondary | ICD-10-CM

## 2020-02-19 MED ORDER — AFLIBERCEPT 2MG/0.05ML IZ SOLN FOR KALEIDOSCOPE
2.0000 mg | INTRAVITREAL | Status: AC | PRN
Start: 2020-02-19 — End: 2020-02-19
  Administered 2020-02-19: 2 mg via INTRAVITREAL

## 2020-02-19 NOTE — Progress Notes (Signed)
02/19/2020     CHIEF COMPLAINT Patient presents for Retina Follow Up   HISTORY OF PRESENT ILLNESS: Robert Doyle is a 86 y.o. male who presents to the clinic today for:   HPI    Retina Follow Up    Patient presents with  Wet AMD.  In right eye.  This started 6 weeks ago.  Severity is mild.  Duration of 6 weeks.  Since onset it is stable.          Comments    6 Week AMD F/U OD, poss Eylea OD  Pt denies noticeable changes to [place] OU since last visit. Pt denies ocular pain, flashes of light, or floaters OU.         Last edited by Rockie Neighbours, [place] on 02/19/2020  1:12 PM. (History)      Referring physician: Lajean Manes, MD [address] [place],  [place] [address]  HISTORICAL INFORMATION:   Selected notes from the MEDICAL RECORD NUMBER       CURRENT MEDICATIONS: Current Outpatient Medications (Ophthalmic Drugs)  Medication Sig  . latanoprost (XALATAN) 0.005 % ophthalmic solution Place 1 drop into both eyes daily. (Patient not taking: Reported on 02/19/2020)   No current facility-administered medications for this visit. (Ophthalmic Drugs)   Current Outpatient Medications (Other)  Medication Sig  . albuterol (PROAIR HFA) 108 (90 BASE) MCG/ACT inhaler Inhale 2 puffs into the lungs every 4 (four) hours as needed for wheezing or shortness of breath.  Marland Kitchen albuterol (PROVENTIL) (2.5 MG/3ML) 0.083% nebulizer solution Take 2.5 mg by nebulization every 6 (six) hours as needed for wheezing or shortness of breath.  Marland Kitchen albuterol (PROVENTIL) (2.5 MG/3ML) 0.083% nebulizer solution USE 1 VIAL IN NEBULIZER EVERY 6 HOURS AS NEEDED FOR WHEEZING/SHORTNESS OF BREATH  . amLODipine (NORVASC) 10 MG tablet Take 10 mg by mouth daily.  Jearl Klinefelter ELLIPTA 62.5-25 MCG/INH AEPB 1 puff daily.  Marland Kitchen aspirin EC 81 MG tablet Take 81 mg by mouth daily.  Marland Kitchen azithromycin (ZITHROMAX) 250 MG tablet Take 2 tablets today then 1 tablet daily until gone  . Calcium Carb-Cholecalciferol (CALCIUM 600 + D PO)  Take 1 tablet by mouth daily.  . Fluticasone Furoate-Vilanterol (BREO ELLIPTA) 100-25 MCG/INH AEPB Inhale 1 puff into the lungs daily.  . hydrochlorothiazide (HYDRODIURIL) 12.5 MG tablet Take 12.5 mg by mouth every morning.  . hydrochlorothiazide (HYDRODIURIL) 25 MG tablet Take 25 mg by mouth every morning.  Marland Kitchen ibuprofen (ADVIL) 800 MG tablet Take 800 mg by mouth 2 (two) times daily.  Marland Kitchen losartan (COZAAR) 50 MG tablet Take 50 mg by mouth daily.   Marland Kitchen lovastatin (MEVACOR) 40 MG tablet Take 40 mg by mouth every morning.   . Multiple Vitamins-Minerals (MULTIVITAMIN PO) Take 1 tablet by mouth daily.  . Multiple Vitamins-Minerals (PRESERVISION AREDS PO) Take 1 tablet by mouth 2 (two) times daily.  . Omega-3 Fatty Acids (FISH OIL) 1200 MG CAPS Take 1,200 mg by mouth daily.   No current facility-administered medications for this visit. (Other)      REVIEW OF SYSTEMS:    ALLERGIES No Known Allergies  PAST MEDICAL HISTORY Past Medical History:  Diagnosis Date  . Allergic rhinitis   . Asthma   . COPD (chronic obstructive pulmonary disease) ([place])   . Hypertension   . Macular degeneration    Past Surgical History:  Procedure Laterality Date  . CATARACT EXTRACTION W/PHACO Left 11/03/2016   Dr. Katy Fitch  . CATARACT EXTRACTION W/PHACO Right 11/22/2016  Dr. Katy Fitch  . COLONOSCOPY WITH PROPOFOL N/A 03/12/2014   Procedure: COLONOSCOPY WITH PROPOFOL;  Surgeon: Garlan Fair, MD;  Location: [facility];  Service: Endoscopy;  Laterality: N/A;  . LUMBAR SPINE SURGERY     x 2 times  . repair deviated septum  40-50 yrs ago  . ROTATOR CUFF REPAIR Left     FAMILY HISTORY Family History  Problem Relation Age of Onset  . Prostate cancer Father   . Macular degeneration Mother     SOCIAL HISTORY Social History   Tobacco Use  . Smoking status: Former Smoker    Packs/day: 2.00    Years: 15.00    Pack years: 30.00    Types: Cigarettes    Quit date: 07/14/1963    Years since quitting: 56.6  .  Smokeless tobacco: Never Used  Substance Use Topics  . Alcohol use: No  . Drug use: No         OPHTHALMIC EXAM:  Base Eye Exam    Visual Acuity (ETDRS)      Right Left   Dist [place] 20/25 -2 20/25 +2       Tonometry (Tonopen, 1:17 PM)      Right Left   Pressure 06 07       Pupils      Pupils Dark Light Shape React APD   Right PERRL 4 3 Round Brisk None   Left PERRL 4 3 Round Brisk None       Visual Fields (Counting fingers)      Left Right    Full Full       Extraocular Movement      Right Left    Full Full       Neuro/Psych    Oriented x3: Yes   Mood/Affect: Normal       Dilation    Right eye: 1.0% Mydriacyl, 2.5% Phenylephrine @ 1:17 PM        Slit Lamp and Fundus Exam    External Exam      Right Left   External Normal Normal       Slit Lamp Exam      Right Left   Lids/Lashes Normal Normal   Conjunctiva/Sclera White and quiet White and quiet   Cornea Clear Clear   Anterior Chamber Deep and quiet Deep and quiet   Iris Round and reactive Round and reactive   Lens Posterior chamber intraocular lens Posterior chamber intraocular lens   Anterior Vitreous Normal Normal       Fundus Exam      Right Left   Posterior Vitreous Posterior vitreous detachment    Disc Normal    C/D Ratio 0.35    Macula Retinal pigment epithelial mottling, no macular thickening, no hemorrhage, no exudates, Pigmented atrophy    Vessels Normal    Periphery Normal           IMAGING AND PROCEDURES  Imaging and Procedures for 02/19/20  OCT, Retina - OU - Both Eyes       Right Eye Quality was good. Scan locations included subfoveal. Central Foveal Thickness: 305. Progression has improved. Findings include pigment epithelial detachment.   Left Eye Quality was good. Scan locations included subfoveal. Central Foveal Thickness: 272. Progression has been stable. Findings include no SRF, retinal drusen , no IRF.   Notes  Much less intraretinal fluid overlying the pigment  epithelial detachment superiorly.       Intravitreal Injection, Pharmacologic Agent - OD - Right Eye  Time Out 02/19/2020. 1:38 PM. Confirmed correct patient, procedure, site, and patient consented.   Anesthesia Anesthetic medications included Akten 3.5%.   Procedure Preparation included Tobramycin 0.3%, 10% betadine to eyelids, 5% betadine to ocular surface. A 30 gauge needle was used.   Injection:  2 mg aflibercept Alfonse Flavors) SOLN   NDC: [id number], Lot: [id number]   Route: Intravitreal, Site: Right Eye, Waste: 0 mg  Post-op Post injection exam found visual acuity of at least counting fingers. The patient tolerated the procedure well. There were no complications. Post injection medications were not given.                 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      ICD-10-CM   1. Exudative age-related macular degeneration of right eye with active choroidal neovascularization (HCC)  H35.3211 OCT, Retina - OU - Both Eyes    Intravitreal Injection, Pharmacologic Agent - OD - Right Eye    aflibercept (EYLEA) SOLN 2 mg  2. Advanced nonexudative age-related macular degeneration of right eye without subfoveal involvement  H35.3113   3. Advanced nonexudative age-related macular degeneration of left eye with subfoveal involvement  H35.3124     1.  2.  3.  Ophthalmic Meds Ordered this visit:  Meds ordered this encounter  Medications  . aflibercept (EYLEA) SOLN 2 mg       Return in about 8 weeks (around 04/15/2020) for dilate, OD, EYLEA OCT.  Patient Instructions  Patient to report new onset visual decline or distortion    Explained the diagnoses, plan, and follow up with the patient and they expressed understanding.  Patient expressed understanding of the importance of  proper follow up care.   Clent Demark Adeena Bernabe M.D. Diseases & Surgery of the Retina and Vitreous Retina & Diabetic [place] 02/19/20     Abbreviations: M myopia (nearsighted); A astigmatism; H hyperopia (farsighted); P presbyopia; Mrx spectacle prescription;  CTL contact lenses; OD right eye; OS left eye; OU both eyes  XT exotropia; ET esotropia; PEK punctate epithelial keratitis; PEE punctate epithelial erosions; DES dry eye syndrome; MGD meibomian gland dysfunction; ATs artificial tears; PFAT's preservative free artificial tears; [place] nuclear sclerotic cataract; PSC posterior subcapsular cataract; ERM epi-retinal membrane; PVD posterior vitreous detachment; RD retinal detachment; DM diabetes mellitus; DR diabetic retinopathy; NPDR non-proliferative diabetic retinopathy; PDR proliferative diabetic retinopathy; CSME clinically significant macular edema; DME diabetic macular edema; dbh dot blot hemorrhages; CWS cotton wool spot; POAG primary open angle glaucoma; C/D cup-to-disc ratio; HVF humphrey visual field; GVF goldmann visual field; OCT optical coherence tomography; IOP intraocular pressure; BRVO Branch retinal vein occlusion; CRVO central retinal vein occlusion; CRAO central retinal artery occlusion; BRAO branch retinal artery occlusion; RT retinal tear; SB scleral buckle; PPV pars plana vitrectomy; VH Vitreous hemorrhage; PRP panretinal laser photocoagulation; IVK intravitreal kenalog; VMT vitreomacular traction; MH Macular hole;  NVD neovascularization of the disc; NVE neovascularization elsewhere; AREDS age related eye disease study; ARMD age related macular degeneration; POAG primary open angle glaucoma; EBMD epithelial/anterior basement membrane dystrophy; ACIOL anterior chamber intraocular lens; IOL intraocular lens; PCIOL posterior chamber intraocular lens; Phaco/IOL phacoemulsification with intraocular lens placement; [place] photorefractive keratectomy; LASIK laser assisted in situ keratomileusis;  HTN hypertension; DM diabetes mellitus; COPD chronic obstructive pulmonary disease

## 2020-02-19 NOTE — Assessment & Plan Note (Signed)
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

## 2020-02-19 NOTE — Patient Instructions (Signed)
Patient to report new onset visual decline or distortion

## 2020-03-12 DIAGNOSIS — N1831 Chronic kidney disease, stage 3a: Secondary | ICD-10-CM | POA: Diagnosis not present

## 2020-03-12 DIAGNOSIS — I129 Hypertensive chronic kidney disease with stage 1 through stage 4 chronic kidney disease, or unspecified chronic kidney disease: Secondary | ICD-10-CM | POA: Diagnosis not present

## 2020-03-20 DIAGNOSIS — J449 Chronic obstructive pulmonary disease, unspecified: Secondary | ICD-10-CM | POA: Diagnosis not present

## 2020-03-20 DIAGNOSIS — N1831 Chronic kidney disease, stage 3a: Secondary | ICD-10-CM | POA: Diagnosis not present

## 2020-03-20 DIAGNOSIS — J45909 Unspecified asthma, uncomplicated: Secondary | ICD-10-CM | POA: Diagnosis not present

## 2020-03-20 DIAGNOSIS — I129 Hypertensive chronic kidney disease with stage 1 through stage 4 chronic kidney disease, or unspecified chronic kidney disease: Secondary | ICD-10-CM | POA: Diagnosis not present

## 2020-03-20 DIAGNOSIS — E782 Mixed hyperlipidemia: Secondary | ICD-10-CM | POA: Diagnosis not present

## 2020-04-02 DIAGNOSIS — H34211 Partial retinal artery occlusion, right eye: Secondary | ICD-10-CM | POA: Diagnosis not present

## 2020-04-02 DIAGNOSIS — H0102B Squamous blepharitis left eye, upper and lower eyelids: Secondary | ICD-10-CM | POA: Diagnosis not present

## 2020-04-02 DIAGNOSIS — H401132 Primary open-angle glaucoma, bilateral, moderate stage: Secondary | ICD-10-CM | POA: Diagnosis not present

## 2020-04-02 DIAGNOSIS — H353122 Nonexudative age-related macular degeneration, left eye, intermediate dry stage: Secondary | ICD-10-CM | POA: Diagnosis not present

## 2020-04-02 DIAGNOSIS — H0102A Squamous blepharitis right eye, upper and lower eyelids: Secondary | ICD-10-CM | POA: Diagnosis not present

## 2020-04-02 DIAGNOSIS — Z961 Presence of intraocular lens: Secondary | ICD-10-CM | POA: Diagnosis not present

## 2020-04-02 DIAGNOSIS — H26491 Other secondary cataract, right eye: Secondary | ICD-10-CM | POA: Diagnosis not present

## 2020-04-02 DIAGNOSIS — H353211 Exudative age-related macular degeneration, right eye, with active choroidal neovascularization: Secondary | ICD-10-CM | POA: Diagnosis not present

## 2020-04-05 DIAGNOSIS — J449 Chronic obstructive pulmonary disease, unspecified: Secondary | ICD-10-CM | POA: Diagnosis not present

## 2020-04-05 DIAGNOSIS — Z23 Encounter for immunization: Secondary | ICD-10-CM | POA: Diagnosis not present

## 2020-04-05 DIAGNOSIS — E782 Mixed hyperlipidemia: Secondary | ICD-10-CM | POA: Diagnosis not present

## 2020-04-05 DIAGNOSIS — I129 Hypertensive chronic kidney disease with stage 1 through stage 4 chronic kidney disease, or unspecified chronic kidney disease: Secondary | ICD-10-CM | POA: Diagnosis not present

## 2020-04-05 DIAGNOSIS — S8011XA Contusion of right lower leg, initial encounter: Secondary | ICD-10-CM | POA: Diagnosis not present

## 2020-04-05 DIAGNOSIS — I700 Atherosclerosis of aorta: Secondary | ICD-10-CM | POA: Diagnosis not present

## 2020-04-05 DIAGNOSIS — H34211 Partial retinal artery occlusion, right eye: Secondary | ICD-10-CM | POA: Diagnosis not present

## 2020-04-05 DIAGNOSIS — N1831 Chronic kidney disease, stage 3a: Secondary | ICD-10-CM | POA: Diagnosis not present

## 2020-04-10 DIAGNOSIS — I6523 Occlusion and stenosis of bilateral carotid arteries: Secondary | ICD-10-CM | POA: Diagnosis not present

## 2020-04-10 DIAGNOSIS — H34211 Partial retinal artery occlusion, right eye: Secondary | ICD-10-CM | POA: Diagnosis not present

## 2020-04-10 DIAGNOSIS — H34219 Partial retinal artery occlusion, unspecified eye: Secondary | ICD-10-CM | POA: Diagnosis not present

## 2020-04-11 DIAGNOSIS — N1831 Chronic kidney disease, stage 3a: Secondary | ICD-10-CM | POA: Diagnosis not present

## 2020-04-11 DIAGNOSIS — I129 Hypertensive chronic kidney disease with stage 1 through stage 4 chronic kidney disease, or unspecified chronic kidney disease: Secondary | ICD-10-CM | POA: Diagnosis not present

## 2020-04-15 ENCOUNTER — Ambulatory Visit (INDEPENDENT_AMBULATORY_CARE_PROVIDER_SITE_OTHER): Payer: PPO | Admitting: Ophthalmology

## 2020-04-15 ENCOUNTER — Encounter (INDEPENDENT_AMBULATORY_CARE_PROVIDER_SITE_OTHER): Payer: Self-pay | Admitting: Ophthalmology

## 2020-04-15 ENCOUNTER — Other Ambulatory Visit: Payer: Self-pay

## 2020-04-15 DIAGNOSIS — H34231 Retinal artery branch occlusion, right eye: Secondary | ICD-10-CM | POA: Diagnosis not present

## 2020-04-15 DIAGNOSIS — H353211 Exudative age-related macular degeneration, right eye, with active choroidal neovascularization: Secondary | ICD-10-CM

## 2020-04-15 MED ORDER — AFLIBERCEPT 2MG/0.05ML IZ SOLN FOR KALEIDOSCOPE
2.0000 mg | INTRAVITREAL | Status: AC | PRN
  Administered 2020-04-15: 2 mg via INTRAVITREAL

## 2020-04-15 NOTE — Assessment & Plan Note (Signed)
Branch retinal artery occlusion reported upon recent examination of right eye Dr. Duanne Guess.  Patient was superotemporal to the macula near a small flat nevus

## 2020-04-15 NOTE — Progress Notes (Signed)
04/15/2020     CHIEF COMPLAINT Patient presents for Retina Follow Up   HISTORY OF PRESENT ILLNESS: Robert Doyle is a 86 y.o. male who presents to the clinic today for:   HPI    Retina Follow Up    Patient presents with  Wet AMD.  In right eye.  This started 8 weeks ago.  Severity is mild.  Duration of 8 weeks.  Since onset it is stable.          Comments    8 Week AMD F/U OD, poss Eylea OD  Pt reports VA OD may not be as good as it usually is. No other new symptoms reported OU.       Last edited by Rockie Neighbours, [place] on 04/15/2020  1:00 PM. (History)      Referring physician: Lajean Manes, MD [address],  [place] [address]  HISTORICAL INFORMATION:   Selected notes from the MEDICAL RECORD NUMBER       CURRENT MEDICATIONS: Current Outpatient Medications (Ophthalmic Drugs)  Medication Sig  . latanoprost (XALATAN) 0.005 % ophthalmic solution Place 1 drop into both eyes daily. (Patient not taking: Reported on 02/19/2020)   No current facility-administered medications for this visit. (Ophthalmic Drugs)   Current Outpatient Medications (Other)  Medication Sig  . albuterol (PROAIR HFA) 108 (90 BASE) MCG/ACT inhaler Inhale 2 puffs into the lungs every 4 (four) hours as needed for wheezing or shortness of breath.  Marland Kitchen albuterol (PROVENTIL) (2.5 MG/3ML) 0.083% nebulizer solution Take 2.5 mg by nebulization every 6 (six) hours as needed for wheezing or shortness of breath.  Marland Kitchen albuterol (PROVENTIL) (2.5 MG/3ML) 0.083% nebulizer solution USE 1 VIAL IN NEBULIZER EVERY 6 HOURS AS NEEDED FOR WHEEZING/SHORTNESS OF BREATH  . amLODipine (NORVASC) 10 MG tablet Take 10 mg by mouth daily.  Jearl Klinefelter ELLIPTA 62.5-25 MCG/INH AEPB 1 puff daily.  Marland Kitchen aspirin EC 81 MG tablet Take 81 mg by mouth daily.  Marland Kitchen azithromycin (ZITHROMAX) 250 MG tablet Take 2 tablets today then 1 tablet daily until gone  . Calcium Carb-Cholecalciferol (CALCIUM 600 + D PO) Take 1 tablet by mouth daily.  .  Fluticasone Furoate-Vilanterol (BREO ELLIPTA) 100-25 MCG/INH AEPB Inhale 1 puff into the lungs daily.  . hydrochlorothiazide (HYDRODIURIL) 12.5 MG tablet Take 12.5 mg by mouth every morning.  . hydrochlorothiazide (HYDRODIURIL) 25 MG tablet Take 25 mg by mouth every morning.  Marland Kitchen ibuprofen (ADVIL) 800 MG tablet Take 800 mg by mouth 2 (two) times daily.  Marland Kitchen losartan (COZAAR) 50 MG tablet Take 50 mg by mouth daily.   Marland Kitchen lovastatin (MEVACOR) 40 MG tablet Take 40 mg by mouth every morning.   . Multiple Vitamins-Minerals (MULTIVITAMIN PO) Take 1 tablet by mouth daily.  . Multiple Vitamins-Minerals (PRESERVISION AREDS PO) Take 1 tablet by mouth 2 (two) times daily.  . Omega-3 Fatty Acids (FISH OIL) 1200 MG CAPS Take 1,200 mg by mouth daily.   No current facility-administered medications for this visit. (Other)      REVIEW OF SYSTEMS:    ALLERGIES No Known Allergies  PAST MEDICAL HISTORY Past Medical History:  Diagnosis Date  . Allergic rhinitis   . Asthma   . COPD (chronic obstructive pulmonary disease) ([place])   . Hypertension   . Macular degeneration    Past Surgical History:  Procedure Laterality Date  . CATARACT EXTRACTION W/PHACO Left 11/03/2016   Dr. Katy Fitch  . CATARACT EXTRACTION W/PHACO Right 11/22/2016   Dr. Katy Fitch  . COLONOSCOPY  WITH PROPOFOL N/A 03/12/2014   Procedure: COLONOSCOPY WITH PROPOFOL;  Surgeon: Garlan Fair, MD;  Location: [facility];  Service: Endoscopy;  Laterality: N/A;  . LUMBAR SPINE SURGERY     x 2 times  . repair deviated septum  40-50 yrs ago  . ROTATOR CUFF REPAIR Left     FAMILY HISTORY Family History  Problem Relation Age of Onset  . Prostate cancer Father   . Macular degeneration Mother     SOCIAL HISTORY Social History   Tobacco Use  . Smoking status: Former Smoker    Packs/day: 2.00    Years: 15.00    Pack years: 30.00    Types: Cigarettes    Quit date: 07/14/1963    Years since quitting: 56.7  . Smokeless tobacco: Never Used    Substance Use Topics  . Alcohol use: No  . Drug use: No         OPHTHALMIC EXAM: Base Eye Exam    Visual Acuity (ETDRS)      Right Left   Dist [place] 20/30 +1 20/30 +2   Dist ph [place] 20/25 20/20       Tonometry (Tonopen, 1:00 PM)      Right Left   Pressure 10 10       Pupils      Pupils Dark Light Shape React APD   Right PERRL 4 3 Round Brisk None   Left PERRL 4 3 Round Brisk None       Visual Fields (Counting fingers)      Left Right    Full Full       Extraocular Movement      Right Left    Full Full       Neuro/Psych    Oriented x3: Yes   Mood/Affect: Normal       Dilation    Right eye: 1.0% Mydriacyl, 2.5% Phenylephrine @ 1:03 PM        Slit Lamp and Fundus Exam    External Exam      Right Left   External Normal Normal       Slit Lamp Exam      Right Left   Lids/Lashes Normal Normal   Conjunctiva/Sclera White and quiet White and quiet   Cornea Clear Clear   Anterior Chamber Deep and quiet Deep and quiet   Iris Round and reactive Round and reactive   Lens Posterior chamber intraocular lens Posterior chamber intraocular lens   Anterior Vitreous Normal Normal       Fundus Exam      Right Left   Posterior Vitreous Posterior vitreous detachment    Disc Normal    C/D Ratio 0.35    Macula Retinal pigment epithelial mottling, serous macular thickening, no hemorrhage, no exudates, Pigmented atrophy    Vessels Normal, no signs of Hollenhorst plaque seen, as it was noted recent examination on 04/02/2020 and that plaque was present between eye nevus superotemporally and the macula    Periphery Normal, small flat nevus along the superotemporal arcade           IMAGING AND PROCEDURES  Imaging and Procedures for 04/15/20  OCT, Retina - OU - Both Eyes       Right Eye Quality was good. Scan locations included subfoveal. Central Foveal Thickness: 389. Progression has worsened. Findings include subretinal fluid, pigment epithelial detachment.   Left  Eye Quality was good. Scan locations included subfoveal. Central Foveal Thickness: 271. Progression has been stable. Findings include  retinal drusen , no SRF, no IRF.   Notes Serous retinal detachment in the foveal region, new and worse since the last visit and treatment date right eye 8 weeks prior       Intravitreal Injection, Pharmacologic Agent - OD - Right Eye       Time Out 04/15/2020. 2:03 PM. Confirmed correct patient, procedure, site, and patient consented.   Anesthesia Anesthetic medications included Akten 3.5%.   Procedure Preparation included Tobramycin 0.3%, 10% betadine to eyelids, 5% betadine to ocular surface, Ofloxacin . A 30 gauge needle was used.   Injection:  2 mg aflibercept Alfonse Flavors) SOLN   NDC: A3590391, Lot: [id number]   Route: Intravitreal, Site: Right Eye, Waste: 0 mg  Post-op Post injection exam found visual acuity of at least counting fingers. The patient tolerated the procedure well. There were no complications. Post injection medications were not given.                 ASSESSMENT/PLAN:  Exudative age-related macular degeneration of right eye with active choroidal neovascularization (HCC) Today from wet ARMD, now worse with serous retinal detachment.  We will repeat injection intravitreal Eylea and examination in 6 weeks OD  Retinal artery occlusion, branch, right Branch retinal artery occlusion reported upon recent examination of right eye Dr. Duanne Guess.  Patient was superotemporal to the macula near a small flat nevus      ICD-10-CM   1. Exudative age-related macular degeneration of right eye with active choroidal neovascularization (HCC)  H35.3211 OCT, Retina - OU - Both Eyes    Intravitreal Injection, Pharmacologic Agent - OD - Right Eye    aflibercept (EYLEA) SOLN 2 mg  2. Retinal artery occlusion, branch, right  H34.231     1.  Patient has completed his carotid Doppler work-up to look for signs of proximal occlusion to  the right branch of the artery cholesterol plaque detected within the last 2 weeks.  Doppler report is said to be normal  2.  Repeat intravitreal Eylea OD today at 8-week interval and shorten interval examination 6-week because of new activity  3.  Ophthalmic Meds Ordered this visit:  Meds ordered this encounter  Medications  . aflibercept (EYLEA) SOLN 2 mg       Return in about 6 weeks (around 05/27/2020) for dilate, OD, EYLEA OCT.  There are no Patient Instructions on file for this visit.   Explained the diagnoses, plan, and follow up with the patient and they expressed understanding.  Patient expressed understanding of the importance of proper follow up care.   Clent Demark Emmarie Sannes M.D. Diseases & Surgery of the Retina and Vitreous Retina & Diabetic [place] 04/15/20     Abbreviations: M myopia (nearsighted); A astigmatism; H hyperopia (farsighted); P presbyopia; Mrx spectacle prescription;  CTL contact lenses; OD right eye; OS left eye; OU both eyes  XT exotropia; ET esotropia; PEK punctate epithelial keratitis; PEE punctate epithelial erosions; DES dry eye syndrome; MGD meibomian gland dysfunction; ATs artificial tears; PFAT's preservative free artificial tears; [place] nuclear sclerotic cataract; PSC posterior subcapsular cataract; ERM epi-retinal membrane; PVD posterior vitreous detachment; RD retinal detachment; DM diabetes mellitus; DR diabetic retinopathy; NPDR non-proliferative diabetic retinopathy; PDR proliferative diabetic retinopathy; CSME clinically significant macular edema; DME diabetic macular edema; dbh dot blot hemorrhages; CWS cotton wool spot; POAG primary open angle glaucoma; C/D cup-to-disc ratio; HVF humphrey visual field; GVF goldmann visual field; OCT optical coherence tomography; IOP intraocular pressure; BRVO Branch retinal vein occlusion; CRVO central retinal vein  occlusion; CRAO central retinal artery occlusion; BRAO branch retinal artery occlusion; RT retinal  tear; SB scleral buckle; PPV pars plana vitrectomy; VH Vitreous hemorrhage; PRP panretinal laser photocoagulation; IVK intravitreal kenalog; VMT vitreomacular traction; MH Macular hole;  NVD neovascularization of the disc; NVE neovascularization elsewhere; AREDS age related eye disease study; ARMD age related macular degeneration; POAG primary open angle glaucoma; EBMD epithelial/anterior basement membrane dystrophy; ACIOL anterior chamber intraocular lens; IOL intraocular lens; PCIOL posterior chamber intraocular lens; Phaco/IOL phacoemulsification with intraocular lens placement; [place] photorefractive keratectomy; LASIK laser assisted in situ keratomileusis; HTN hypertension; DM diabetes mellitus; COPD chronic obstructive pulmonary disease

## 2020-04-15 NOTE — Patient Instructions (Signed)
Patient asked to report any new onset visual acuity declines or distortions in either eye

## 2020-04-15 NOTE — Assessment & Plan Note (Signed)
Today from wet ARMD, now worse with serous retinal detachment.  We will repeat injection intravitreal Eylea and examination in 6 weeks OD

## 2020-05-28 ENCOUNTER — Encounter (INDEPENDENT_AMBULATORY_CARE_PROVIDER_SITE_OTHER): Payer: Self-pay | Admitting: Ophthalmology

## 2020-05-28 ENCOUNTER — Ambulatory Visit (INDEPENDENT_AMBULATORY_CARE_PROVIDER_SITE_OTHER): Payer: PPO | Admitting: Ophthalmology

## 2020-05-28 ENCOUNTER — Other Ambulatory Visit: Payer: Self-pay

## 2020-05-28 DIAGNOSIS — H353124 Nonexudative age-related macular degeneration, left eye, advanced atrophic with subfoveal involvement: Secondary | ICD-10-CM | POA: Diagnosis not present

## 2020-05-28 DIAGNOSIS — H353211 Exudative age-related macular degeneration, right eye, with active choroidal neovascularization: Secondary | ICD-10-CM | POA: Diagnosis not present

## 2020-05-28 DIAGNOSIS — H35721 Serous detachment of retinal pigment epithelium, right eye: Secondary | ICD-10-CM | POA: Diagnosis not present

## 2020-05-28 MED ORDER — AFLIBERCEPT 2MG/0.05ML IZ SOLN FOR KALEIDOSCOPE
2.0000 mg | INTRAVITREAL | Status: AC | PRN
  Administered 2020-05-28: 2 mg via INTRAVITREAL

## 2020-05-28 NOTE — Assessment & Plan Note (Signed)
Improved at 6-week interval on [facility]

## 2020-05-28 NOTE — Assessment & Plan Note (Signed)
Active CNVM with serous subfoveal fluid, improved today at 6-week interval post intravitreal Eylea, repeat injection today and examination in 6 weeks

## 2020-05-28 NOTE — Progress Notes (Signed)
05/28/2020     CHIEF COMPLAINT Patient presents for Retina Follow Up   HISTORY OF PRESENT ILLNESS: Robert Doyle is a 86 y.o. male who presents to the clinic today for:   HPI    Retina Follow Up    Patient presents with  Wet AMD.  In right eye.  Severity is moderate.  Duration of 6 weeks.  Since onset it is stable.  I, the attending physician,  performed the HPI with the patient and updated documentation appropriately.          Comments    6 Week Wet AMD f\u OD. Possible Eylea OD. OCT  Pt states no changes in vision. Denies complaints.       Last edited by Tilda Franco on 05/28/2020  1:51 PM. (History)      Referring physician: Lajean Manes, MD [address],  [place] [address]  HISTORICAL INFORMATION:   Selected notes from the MEDICAL RECORD NUMBER       CURRENT MEDICATIONS: Current Outpatient Medications (Ophthalmic Drugs)  Medication Sig  . latanoprost (XALATAN) 0.005 % ophthalmic solution Place 1 drop into both eyes daily. (Patient not taking: Reported on 02/19/2020)   No current facility-administered medications for this visit. (Ophthalmic Drugs)   Current Outpatient Medications (Other)  Medication Sig  . albuterol (PROAIR HFA) 108 (90 BASE) MCG/ACT inhaler Inhale 2 puffs into the lungs every 4 (four) hours as needed for wheezing or shortness of breath.  Marland Kitchen albuterol (PROVENTIL) (2.5 MG/3ML) 0.083% nebulizer solution Take 2.5 mg by nebulization every 6 (six) hours as needed for wheezing or shortness of breath.  Marland Kitchen albuterol (PROVENTIL) (2.5 MG/3ML) 0.083% nebulizer solution USE 1 VIAL IN NEBULIZER EVERY 6 HOURS AS NEEDED FOR WHEEZING/SHORTNESS OF BREATH  . amLODipine (NORVASC) 10 MG tablet Take 10 mg by mouth daily.  Jearl Klinefelter ELLIPTA 62.5-25 MCG/INH AEPB 1 puff daily.  Marland Kitchen aspirin EC 81 MG tablet Take 81 mg by mouth daily.  Marland Kitchen azithromycin (ZITHROMAX) 250 MG tablet Take 2 tablets today then 1 tablet daily until gone  . Calcium Carb-Cholecalciferol  (CALCIUM 600 + D PO) Take 1 tablet by mouth daily.  . Fluticasone Furoate-Vilanterol (BREO ELLIPTA) 100-25 MCG/INH AEPB Inhale 1 puff into the lungs daily.  . hydrochlorothiazide (HYDRODIURIL) 12.5 MG tablet Take 12.5 mg by mouth every morning.  . hydrochlorothiazide (HYDRODIURIL) 25 MG tablet Take 25 mg by mouth every morning.  Marland Kitchen ibuprofen (ADVIL) 800 MG tablet Take 800 mg by mouth 2 (two) times daily.  Marland Kitchen losartan (COZAAR) 50 MG tablet Take 50 mg by mouth daily.   Marland Kitchen lovastatin (MEVACOR) 40 MG tablet Take 40 mg by mouth every morning.   . Multiple Vitamins-Minerals (MULTIVITAMIN PO) Take 1 tablet by mouth daily.  . Multiple Vitamins-Minerals (PRESERVISION AREDS PO) Take 1 tablet by mouth 2 (two) times daily.  . Omega-3 Fatty Acids (FISH OIL) 1200 MG CAPS Take 1,200 mg by mouth daily.   No current facility-administered medications for this visit. (Other)      REVIEW OF SYSTEMS:    ALLERGIES No Known Allergies  PAST MEDICAL HISTORY Past Medical History:  Diagnosis Date  . Allergic rhinitis   . Asthma   . COPD (chronic obstructive pulmonary disease) ([place])   . Hypertension   . Macular degeneration    Past Surgical History:  Procedure Laterality Date  . CATARACT EXTRACTION W/PHACO Left 11/03/2016   Dr. Katy Fitch  . CATARACT EXTRACTION W/PHACO Right 11/22/2016   Dr. Katy Fitch  .  COLONOSCOPY WITH PROPOFOL N/A 03/12/2014   Procedure: COLONOSCOPY WITH PROPOFOL;  Surgeon: Garlan Fair, MD;  Location: [facility];  Service: Endoscopy;  Laterality: N/A;  . LUMBAR SPINE SURGERY     x 2 times  . repair deviated septum  40-50 yrs ago  . ROTATOR CUFF REPAIR Left     FAMILY HISTORY Family History  Problem Relation Age of Onset  . Prostate cancer Father   . Macular degeneration Mother     SOCIAL HISTORY Social History   Tobacco Use  . Smoking status: Former Smoker    Packs/day: 2.00    Years: 15.00    Pack years: 30.00    Types: Cigarettes    Quit date: 07/14/1963    Years  since quitting: 56.9  . Smokeless tobacco: Never Used  Substance Use Topics  . Alcohol use: No  . Drug use: No         OPHTHALMIC EXAM: Base Eye Exam    Visual Acuity (Snellen - Linear)      Right Left   Dist [place] 20/40 -1 20/30   Dist ph [place] 20/25 + 20/25 +       Tonometry (Tonopen, 1:55 PM)      Right Left   Pressure 10 11       Pupils      Pupils Dark Light Shape React APD   Right PERRL 4 3 Round Brisk None   Left PERRL 4 3 Round Brisk None       Visual Fields (Counting fingers)      Left Right    Full Full       Neuro/Psych    Oriented x3: Yes   Mood/Affect: Normal       Dilation    Right eye: 1.0% Mydriacyl, 2.5% Phenylephrine @ 1:55 PM        Slit Lamp and Fundus Exam    External Exam      Right Left   External Normal Normal       Slit Lamp Exam      Right Left   Lids/Lashes Normal Normal   Conjunctiva/Sclera White and quiet White and quiet   Cornea Clear Clear   Anterior Chamber Deep and quiet Deep and quiet   Iris Round and reactive Round and reactive   Lens Posterior chamber intraocular lens Posterior chamber intraocular lens   Anterior Vitreous Normal Normal       Fundus Exam      Right Left   Posterior Vitreous Posterior vitreous detachment    Disc Normal    C/D Ratio 0.35    Macula Retinal pigment epithelial mottling, serous macular thickening, no hemorrhage, no exudates, Pigmented atrophy    Vessels Normal, no signs of Hollenhorst plaque seen, as it was noted recent examination on 04/02/2020 and that plaque was present between eye nevus superotemporally and the macula    Periphery Normal, small flat nevus along the superotemporal arcade           IMAGING AND PROCEDURES  Imaging and Procedures for 05/28/20  OCT, Retina - OU - Both Eyes       Right Eye Quality was good. Scan locations included subfoveal. Central Foveal Thickness: 322. Progression has improved. Findings include abnormal foveal contour, subretinal fluid, pigment  epithelial detachment.   Left Eye Quality was good. Scan locations included subfoveal. Central Foveal Thickness: 271. Progression has been stable. Findings include no IRF, abnormal foveal contour, no SRF.   Notes OD with much smaller  serous retinal detachment, vascularized pigment epithelial detachment superior to fovea       Intravitreal Injection, Pharmacologic Agent - OD - Right Eye       Time Out 05/28/2020. 2:48 PM. Confirmed correct patient, procedure, site, and patient consented.   Anesthesia Topical anesthesia was used. Anesthetic medications included Akten 3.5%.   Procedure Preparation included Tobramycin 0.3%, 10% betadine to eyelids, 5% betadine to ocular surface, Ofloxacin . A 30 gauge needle was used.   Injection:  2 mg aflibercept Alfonse Flavors) SOLN   NDC: [id number], Lot: [id number]   Route: Intravitreal, Site: Right Eye, Waste: 0 mg  Post-op Post injection exam found visual acuity of at least counting fingers. The patient tolerated the procedure well. There were no complications. The patient received written and verbal post procedure care education. Post injection medications were not given.                 ASSESSMENT/PLAN:  Exudative age-related macular degeneration of right eye with active choroidal neovascularization (HCC) Active CNVM with serous subfoveal fluid, improved today at 6-week interval post intravitreal Eylea, repeat injection today and examination in 6 weeks  Advanced nonexudative age-related macular degeneration of left eye with subfoveal involvement No signs of active CNVM OS  Serous detachment of retinal pigment epithelium of right eye Improved at 6-week interval on Eylea      ICD-10-CM   1. Exudative age-related macular degeneration of right eye with active choroidal neovascularization (HCC)  H35.3211 OCT, Retina - OU - Both Eyes    Intravitreal Injection, Pharmacologic Agent - OD - Right Eye    aflibercept (EYLEA) SOLN 2 mg  2.  Advanced nonexudative age-related macular degeneration of left eye with subfoveal involvement  H35.3124   3. Serous detachment of retinal pigment epithelium of right eye  H35.721     1.  Repeat Eylea injection OD today as anatomy has improved, 6-week interval  2.  Follow-up again in 6 weeks until serous retinal detachment has resolved  3.  Ophthalmic Meds Ordered this visit:  Meds ordered this encounter  Medications  . aflibercept (EYLEA) SOLN 2 mg       Return in about 6 weeks (around 07/09/2020) for dilate, OD, EYLEA OCT.  There are no Patient Instructions on file for this visit.   Explained the diagnoses, plan, and follow up with the patient and they expressed understanding.  Patient expressed understanding of the importance of proper follow up care.   Clent Demark Tyee Vandevoorde M.D. Diseases & Surgery of the Retina and Vitreous Retina & Diabetic [place] 05/28/20     Abbreviations: M myopia (nearsighted); A astigmatism; H hyperopia (farsighted); P presbyopia; Mrx spectacle prescription;  CTL contact lenses; OD right eye; OS left eye; OU both eyes  XT exotropia; ET esotropia; PEK punctate epithelial keratitis; PEE punctate epithelial erosions; DES dry eye syndrome; MGD meibomian gland dysfunction; ATs artificial tears; PFAT's preservative free artificial tears; [place] nuclear sclerotic cataract; PSC posterior subcapsular cataract; ERM epi-retinal membrane; PVD posterior vitreous detachment; RD retinal detachment; DM diabetes mellitus; DR diabetic retinopathy; NPDR non-proliferative diabetic retinopathy; PDR proliferative diabetic retinopathy; CSME clinically significant macular edema; DME diabetic macular edema; dbh dot blot hemorrhages; CWS cotton wool spot; POAG primary open angle glaucoma; C/D cup-to-disc ratio; HVF humphrey visual field; GVF goldmann visual field; OCT optical coherence tomography; IOP intraocular pressure; BRVO Branch retinal vein occlusion; CRVO central retinal vein  occlusion; CRAO central retinal artery occlusion; BRAO branch retinal artery occlusion; RT retinal tear; SB scleral buckle; PPV  pars plana vitrectomy; VH Vitreous hemorrhage; PRP panretinal laser photocoagulation; IVK intravitreal kenalog; VMT vitreomacular traction; MH Macular hole;  NVD neovascularization of the disc; NVE neovascularization elsewhere; AREDS age related eye disease study; ARMD age related macular degeneration; POAG primary open angle glaucoma; EBMD epithelial/anterior basement membrane dystrophy; ACIOL anterior chamber intraocular lens; IOL intraocular lens; PCIOL posterior chamber intraocular lens; Phaco/IOL phacoemulsification with intraocular lens placement; [place] photorefractive keratectomy; LASIK laser assisted in situ keratomileusis; HTN hypertension; DM diabetes mellitus; COPD chronic obstructive pulmonary disease

## 2020-05-28 NOTE — Assessment & Plan Note (Signed)
No signs of active CNVM OS 

## 2020-06-11 DIAGNOSIS — I129 Hypertensive chronic kidney disease with stage 1 through stage 4 chronic kidney disease, or unspecified chronic kidney disease: Secondary | ICD-10-CM | POA: Diagnosis not present

## 2020-06-11 DIAGNOSIS — N1831 Chronic kidney disease, stage 3a: Secondary | ICD-10-CM | POA: Diagnosis not present

## 2020-07-09 ENCOUNTER — Encounter (INDEPENDENT_AMBULATORY_CARE_PROVIDER_SITE_OTHER): Payer: PPO | Admitting: Ophthalmology

## 2020-07-11 ENCOUNTER — Other Ambulatory Visit: Payer: Self-pay

## 2020-07-11 ENCOUNTER — Ambulatory Visit (INDEPENDENT_AMBULATORY_CARE_PROVIDER_SITE_OTHER): Payer: PPO | Admitting: Ophthalmology

## 2020-07-11 DIAGNOSIS — I129 Hypertensive chronic kidney disease with stage 1 through stage 4 chronic kidney disease, or unspecified chronic kidney disease: Secondary | ICD-10-CM | POA: Diagnosis not present

## 2020-07-11 DIAGNOSIS — N1831 Chronic kidney disease, stage 3a: Secondary | ICD-10-CM | POA: Diagnosis not present

## 2020-07-11 DIAGNOSIS — J449 Chronic obstructive pulmonary disease, unspecified: Secondary | ICD-10-CM | POA: Diagnosis not present

## 2020-07-11 DIAGNOSIS — H353211 Exudative age-related macular degeneration, right eye, with active choroidal neovascularization: Secondary | ICD-10-CM | POA: Diagnosis not present

## 2020-07-11 DIAGNOSIS — E782 Mixed hyperlipidemia: Secondary | ICD-10-CM | POA: Diagnosis not present

## 2020-07-11 DIAGNOSIS — J45909 Unspecified asthma, uncomplicated: Secondary | ICD-10-CM | POA: Diagnosis not present

## 2020-07-11 MED ORDER — AFLIBERCEPT 2MG/0.05ML IZ SOLN FOR KALEIDOSCOPE
2.0000 mg | INTRAVITREAL | Status: AC | PRN
  Administered 2020-07-11: 2 mg via INTRAVITREAL

## 2020-07-11 NOTE — Assessment & Plan Note (Addendum)
The nature of wet macular degeneration was discussed with the patient.  Forms of therapy reviewed include the use of Anti-VEGF medications injected painlessly into the eye, as well as other possible treatment modalities, including thermal laser therapy. Fellow eye involvement and risks were discussed with the patient. Upon the finding of wet age related macular degeneration, treatment will be offered. The treatment regimen is on a treat as needed basis with the intent to treat if necessary and extend interval of exams when possible. On average 1 out of 6 patients do not need lifetime therapy. However, the risk of recurrent disease is high for a lifetime.  Initially monthly, then periodic, examinations and evaluations will determine whether the next treatment is required on the day of the examination.   We will extend interval next to 7 weeks and and will tolerate subretinal fluid if no impact on acuity and not worsening next

## 2020-07-16 DIAGNOSIS — J449 Chronic obstructive pulmonary disease, unspecified: Secondary | ICD-10-CM | POA: Diagnosis not present

## 2020-07-16 DIAGNOSIS — I129 Hypertensive chronic kidney disease with stage 1 through stage 4 chronic kidney disease, or unspecified chronic kidney disease: Secondary | ICD-10-CM | POA: Diagnosis not present

## 2020-07-16 DIAGNOSIS — N1831 Chronic kidney disease, stage 3a: Secondary | ICD-10-CM | POA: Diagnosis not present

## 2020-07-16 DIAGNOSIS — E782 Mixed hyperlipidemia: Secondary | ICD-10-CM | POA: Diagnosis not present

## 2020-07-16 DIAGNOSIS — J45909 Unspecified asthma, uncomplicated: Secondary | ICD-10-CM | POA: Diagnosis not present

## 2020-08-11 DIAGNOSIS — I129 Hypertensive chronic kidney disease with stage 1 through stage 4 chronic kidney disease, or unspecified chronic kidney disease: Secondary | ICD-10-CM | POA: Diagnosis not present

## 2020-08-11 DIAGNOSIS — N1831 Chronic kidney disease, stage 3a: Secondary | ICD-10-CM | POA: Diagnosis not present

## 2020-08-12 DIAGNOSIS — N1831 Chronic kidney disease, stage 3a: Secondary | ICD-10-CM | POA: Diagnosis not present

## 2020-08-12 DIAGNOSIS — I129 Hypertensive chronic kidney disease with stage 1 through stage 4 chronic kidney disease, or unspecified chronic kidney disease: Secondary | ICD-10-CM | POA: Diagnosis not present

## 2020-08-23 DIAGNOSIS — Z79899 Other long term (current) drug therapy: Secondary | ICD-10-CM | POA: Diagnosis not present

## 2020-08-23 DIAGNOSIS — G629 Polyneuropathy, unspecified: Secondary | ICD-10-CM | POA: Diagnosis not present

## 2020-08-23 DIAGNOSIS — N1831 Chronic kidney disease, stage 3a: Secondary | ICD-10-CM | POA: Diagnosis not present

## 2020-08-23 DIAGNOSIS — I129 Hypertensive chronic kidney disease with stage 1 through stage 4 chronic kidney disease, or unspecified chronic kidney disease: Secondary | ICD-10-CM | POA: Diagnosis not present

## 2020-08-23 DIAGNOSIS — I700 Atherosclerosis of aorta: Secondary | ICD-10-CM | POA: Diagnosis not present

## 2020-08-23 DIAGNOSIS — H3530 Unspecified macular degeneration: Secondary | ICD-10-CM | POA: Diagnosis not present

## 2020-08-23 DIAGNOSIS — E782 Mixed hyperlipidemia: Secondary | ICD-10-CM | POA: Diagnosis not present

## 2020-08-23 DIAGNOSIS — Z0000 Encounter for general adult medical examination without abnormal findings: Secondary | ICD-10-CM | POA: Diagnosis not present

## 2020-08-23 DIAGNOSIS — J449 Chronic obstructive pulmonary disease, unspecified: Secondary | ICD-10-CM | POA: Diagnosis not present

## 2020-08-23 DIAGNOSIS — Z1389 Encounter for screening for other disorder: Secondary | ICD-10-CM | POA: Diagnosis not present

## 2020-08-29 ENCOUNTER — Ambulatory Visit (INDEPENDENT_AMBULATORY_CARE_PROVIDER_SITE_OTHER): Payer: PPO | Admitting: Ophthalmology

## 2020-08-29 ENCOUNTER — Encounter (INDEPENDENT_AMBULATORY_CARE_PROVIDER_SITE_OTHER): Payer: Self-pay | Admitting: Ophthalmology

## 2020-08-29 ENCOUNTER — Other Ambulatory Visit: Payer: Self-pay

## 2020-08-29 DIAGNOSIS — H353211 Exudative age-related macular degeneration, right eye, with active choroidal neovascularization: Secondary | ICD-10-CM

## 2020-08-29 DIAGNOSIS — H353124 Nonexudative age-related macular degeneration, left eye, advanced atrophic with subfoveal involvement: Secondary | ICD-10-CM

## 2020-08-29 MED ORDER — AFLIBERCEPT 2MG/0.05ML IZ SOLN FOR KALEIDOSCOPE
2.0000 mg | INTRAVITREAL | Status: AC | PRN
  Administered 2020-08-29: 2 mg via INTRAVITREAL

## 2020-08-29 NOTE — Progress Notes (Signed)
08/29/2020     CHIEF COMPLAINT Patient presents for Retina Follow Up (7 Week Wet AMD f\u OD. Possible Avastin OD. OCT /Pt states no changes since last visit. Pt states vision is stable.)   HISTORY OF PRESENT ILLNESS: Robert Doyle is a 87 y.o. male who presents to the clinic today for:   HPI    Retina Follow Up    This started 7 weeks ago.  Duration of 7 weeks. Additional comments: 7 Week Wet AMD f\u OD. Possible Avastin OD. OCT  Pt states no changes since last visit. Pt states vision is stable.       Last edited by Nichola Sizer D on 08/29/2020  3:09 PM. (History)      Referring physician: Lajean Manes, MD [address],  [place] [address]  HISTORICAL INFORMATION:   Selected notes from the MEDICAL RECORD NUMBER       CURRENT MEDICATIONS: Current Outpatient Medications (Ophthalmic Drugs)  Medication Sig  . latanoprost (XALATAN) 0.005 % ophthalmic solution Place 1 drop into both eyes daily. (Patient not taking: Reported on 02/19/2020)   No current facility-administered medications for this visit. (Ophthalmic Drugs)   Current Outpatient Medications (Other)  Medication Sig  . albuterol (PROAIR HFA) 108 (90 BASE) MCG/ACT inhaler Inhale 2 puffs into the lungs every 4 (four) hours as needed for wheezing or shortness of breath.  Marland Kitchen albuterol (PROVENTIL) (2.5 MG/3ML) 0.083% nebulizer solution Take 2.5 mg by nebulization every 6 (six) hours as needed for wheezing or shortness of breath.  Marland Kitchen albuterol (PROVENTIL) (2.5 MG/3ML) 0.083% nebulizer solution USE 1 VIAL IN NEBULIZER EVERY 6 HOURS AS NEEDED FOR WHEEZING/SHORTNESS OF BREATH  . amLODipine (NORVASC) 10 MG tablet Take 10 mg by mouth daily.  Jearl Klinefelter ELLIPTA 62.5-25 MCG/INH AEPB 1 puff daily.  Marland Kitchen aspirin EC 81 MG tablet Take 81 mg by mouth daily.  Marland Kitchen azithromycin (ZITHROMAX) 250 MG tablet Take 2 tablets today then 1 tablet daily until gone  . Calcium Carb-Cholecalciferol (CALCIUM 600 + D PO) Take 1 tablet by mouth  daily.  . Fluticasone Furoate-Vilanterol (BREO ELLIPTA) 100-25 MCG/INH AEPB Inhale 1 puff into the lungs daily.  . hydrochlorothiazide (HYDRODIURIL) 12.5 MG tablet Take 12.5 mg by mouth every morning.  . hydrochlorothiazide (HYDRODIURIL) 25 MG tablet Take 25 mg by mouth every morning.  Marland Kitchen ibuprofen (ADVIL) 800 MG tablet Take 800 mg by mouth 2 (two) times daily.  Marland Kitchen losartan (COZAAR) 50 MG tablet Take 50 mg by mouth daily.   Marland Kitchen lovastatin (MEVACOR) 40 MG tablet Take 40 mg by mouth every morning.   . Multiple Vitamins-Minerals (MULTIVITAMIN PO) Take 1 tablet by mouth daily.  . Multiple Vitamins-Minerals (PRESERVISION AREDS PO) Take 1 tablet by mouth 2 (two) times daily.  . Omega-3 Fatty Acids (FISH OIL) 1200 MG CAPS Take 1,200 mg by mouth daily.   No current facility-administered medications for this visit. (Other)      REVIEW OF SYSTEMS:    ALLERGIES No Known Allergies  PAST MEDICAL HISTORY Past Medical History:  Diagnosis Date  . Allergic rhinitis   . Asthma   . COPD (chronic obstructive pulmonary disease) ([place])   . Hypertension   . Macular degeneration    Past Surgical History:  Procedure Laterality Date  . CATARACT EXTRACTION W/PHACO Left 11/03/2016   Dr. Katy Fitch  . CATARACT EXTRACTION W/PHACO Right 11/22/2016   Dr. Katy Fitch  . COLONOSCOPY WITH PROPOFOL N/A 03/12/2014   Procedure: COLONOSCOPY WITH PROPOFOL;  Surgeon: Garlan Fair,  MD;  Location: [facility];  Service: Endoscopy;  Laterality: N/A;  . LUMBAR SPINE SURGERY     x 2 times  . repair deviated septum  40-50 yrs ago  . ROTATOR CUFF REPAIR Left     FAMILY HISTORY Family History  Problem Relation Age of Onset  . Prostate cancer Father   . Macular degeneration Mother     SOCIAL HISTORY Social History   Tobacco Use  . Smoking status: Former Smoker    Packs/day: 2.00    Years: 15.00    Pack years: 30.00    Types: Cigarettes    Quit date: 07/14/1963    Years since quitting: 57.1  . Smokeless tobacco:  Never Used  Substance Use Topics  . Alcohol use: No  . Drug use: No         OPHTHALMIC EXAM: Base Eye Exam    Visual Acuity (ETDRS)      Right Left   Dist [place] 20/25 -2 20/40 +2   Dist ph [place]  20/30 -2       Tonometry (Tonopen, 3:10 PM)      Right Left   Pressure 10 13       Pupils      Pupils Dark Light Shape React APD   Right PERRL 4 3 Round Brisk None   Left PERRL 4 3 Round Brisk None       Visual Fields (Counting fingers)      Left Right    Full Full       Extraocular Movement      Right Left    Full Full       Neuro/Psych    Oriented x3: Yes   Mood/Affect: Normal        Slit Lamp and Fundus Exam    External Exam      Right Left   External Normal Normal       Slit Lamp Exam      Right Left   Lids/Lashes Normal Normal   Conjunctiva/Sclera White and quiet White and quiet   Cornea Clear Clear   Anterior Chamber Deep and quiet Deep and quiet   Iris Round and reactive Round and reactive   Lens Posterior chamber intraocular lens Posterior chamber intraocular lens   Anterior Vitreous Normal Normal       Fundus Exam      Right Left   Posterior Vitreous Posterior vitreous detachment    Disc Normal    C/D Ratio 0.35    Macula Retinal pigment epithelial mottling, serous macular thickening, no hemorrhage, no exudates, Pigmented atrophy    Vessels Normal, no signs of Hollenhorst plaque seen, as it was noted recent examination on 04/02/2020 and that plaque was present between eye nevus superotemporally and the macula    Periphery Normal, small flat nevus along the superotemporal arcade           IMAGING AND PROCEDURES  Imaging and Procedures for 08/29/20  Intravitreal Injection, Pharmacologic Agent - OD - Right Eye       Time Out 08/29/2020. 3:19 PM. Confirmed correct patient, procedure, site, and patient consented.   Anesthesia Topical anesthesia was used. Anesthetic medications included Akten 3.5%.   Procedure Preparation included Tobramycin  0.3%, 10% betadine to eyelids, 5% betadine to ocular surface, Ofloxacin . A 30 gauge needle was used.   Injection:  2 mg aflibercept Alfonse Flavors) SOLN   NDC: 61443-154-00, Lot: [id number]   Route: Intravitreal, Site: Right Eye, Waste: 0 mg  Post-op  Post injection exam found visual acuity of at least counting fingers. The patient tolerated the procedure well. There were no complications. The patient received written and verbal post procedure care education. Post injection medications were not given.        OCT, Retina - OU - Both Eyes       Right Eye Quality was good. Scan locations included subfoveal. Central Foveal Thickness: 328. Progression has been stable. Findings include subretinal fluid, disciform scar, choroidal neovascular membrane.   Left Eye Quality was good. Scan locations included subfoveal. Central Foveal Thickness: 275. Progression has been stable. Findings include abnormal foveal contour, intraretinal hyper-reflective material, subretinal hyper-reflective material.   Notes OD, improved overall CNVM with less subretinal fluid yet persistent through the FAZ, at 7-week interval.  We will extend interval next to 7 weeks and and will tolerate subretinal fluid if no impact on acuity and not worsening next                  ASSESSMENT/PLAN:  Advanced nonexudative age-related macular degeneration of left eye with subfoveal involvement No signs of CNVM OS by OCT evaluation today  Exudative age-related macular degeneration of right eye with active choroidal neovascularization ([place]) Extension of exam interval from 6 to 7 weeks with no interval changes subretinal fluid post intravitreal Eylea.  We will need to repeat Eylea today to maintain acuity and prevent progression.  Will maintain exam interval of 7 weeks      ICD-10-CM   1. Exudative age-related macular degeneration of right eye with active choroidal neovascularization (HCC)  H35.3211 Intravitreal Injection,  Pharmacologic Agent - OD - Right Eye    aflibercept (EYLEA) SOLN 2 mg  2. Advanced nonexudative age-related macular degeneration of left eye with subfoveal involvement  H35.3124     1.  Improved overall OD yet still active CNVM, repeat intravitreal Eylea today at 7-week interval and reexamine OD in 7 weeks  2.  3.  Ophthalmic Meds Ordered this visit:  Meds ordered this encounter  Medications  . aflibercept (EYLEA) SOLN 2 mg       Return in about 7 weeks (around 10/17/2020) for EYLEA OCT, OD, DILATE OU.  There are no Patient Instructions on file for this visit.   Explained the diagnoses, plan, and follow up with the patient and they expressed understanding.  Patient expressed understanding of the importance of proper follow up care.   Clent Demark Lidie Glade M.D. Diseases & Surgery of the Retina and Vitreous Retina & Diabetic [place] 08/29/20     Abbreviations: M myopia (nearsighted); A astigmatism; H hyperopia (farsighted); P presbyopia; Mrx spectacle prescription;  CTL contact lenses; OD right eye; OS left eye; OU both eyes  XT exotropia; ET esotropia; PEK punctate epithelial keratitis; PEE punctate epithelial erosions; DES dry eye syndrome; MGD meibomian gland dysfunction; ATs artificial tears; PFAT's preservative free artificial tears; [place] nuclear sclerotic cataract; PSC posterior subcapsular cataract; ERM epi-retinal membrane; PVD posterior vitreous detachment; RD retinal detachment; DM diabetes mellitus; DR diabetic retinopathy; NPDR non-proliferative diabetic retinopathy; PDR proliferative diabetic retinopathy; CSME clinically significant macular edema; DME diabetic macular edema; dbh dot blot hemorrhages; CWS cotton wool spot; POAG primary open angle glaucoma; C/D cup-to-disc ratio; HVF humphrey visual field; GVF goldmann visual field; OCT optical coherence tomography; IOP intraocular pressure; BRVO Branch retinal vein occlusion; CRVO central retinal vein occlusion; CRAO central  retinal artery occlusion; BRAO branch retinal artery occlusion; RT retinal tear; SB scleral buckle; PPV pars plana vitrectomy; VH Vitreous hemorrhage; PRP panretinal  laser photocoagulation; IVK intravitreal kenalog; VMT vitreomacular traction; MH Macular hole;  NVD neovascularization of the disc; NVE neovascularization elsewhere; AREDS age related eye disease study; ARMD age related macular degeneration; POAG primary open angle glaucoma; EBMD epithelial/anterior basement membrane dystrophy; ACIOL anterior chamber intraocular lens; IOL intraocular lens; PCIOL posterior chamber intraocular lens; Phaco/IOL phacoemulsification with intraocular lens placement; [place] photorefractive keratectomy; LASIK laser assisted in situ keratomileusis; HTN hypertension; DM diabetes mellitus; COPD chronic obstructive pulmonary disease

## 2020-08-29 NOTE — Assessment & Plan Note (Signed)
Extension of exam interval from 6 to 7 weeks with no interval changes subretinal fluid post intravitreal Eylea.  We will need to repeat Eylea today to maintain acuity and prevent progression.  Will maintain exam interval of 7 weeks

## 2020-08-29 NOTE — Assessment & Plan Note (Signed)
No signs of CNVM OS by OCT evaluation today 

## 2020-09-09 DIAGNOSIS — E782 Mixed hyperlipidemia: Secondary | ICD-10-CM | POA: Diagnosis not present

## 2020-09-09 DIAGNOSIS — J449 Chronic obstructive pulmonary disease, unspecified: Secondary | ICD-10-CM | POA: Diagnosis not present

## 2020-09-09 DIAGNOSIS — J45909 Unspecified asthma, uncomplicated: Secondary | ICD-10-CM | POA: Diagnosis not present

## 2020-09-09 DIAGNOSIS — I129 Hypertensive chronic kidney disease with stage 1 through stage 4 chronic kidney disease, or unspecified chronic kidney disease: Secondary | ICD-10-CM | POA: Diagnosis not present

## 2020-09-09 DIAGNOSIS — N1831 Chronic kidney disease, stage 3a: Secondary | ICD-10-CM | POA: Diagnosis not present

## 2020-09-23 DIAGNOSIS — I129 Hypertensive chronic kidney disease with stage 1 through stage 4 chronic kidney disease, or unspecified chronic kidney disease: Secondary | ICD-10-CM | POA: Diagnosis not present

## 2020-09-23 DIAGNOSIS — N1831 Chronic kidney disease, stage 3a: Secondary | ICD-10-CM | POA: Diagnosis not present

## 2020-09-30 ENCOUNTER — Encounter (INDEPENDENT_AMBULATORY_CARE_PROVIDER_SITE_OTHER): Payer: Self-pay

## 2020-09-30 DIAGNOSIS — H34211 Partial retinal artery occlusion, right eye: Secondary | ICD-10-CM | POA: Diagnosis not present

## 2020-09-30 DIAGNOSIS — H401132 Primary open-angle glaucoma, bilateral, moderate stage: Secondary | ICD-10-CM | POA: Diagnosis not present

## 2020-09-30 DIAGNOSIS — H353211 Exudative age-related macular degeneration, right eye, with active choroidal neovascularization: Secondary | ICD-10-CM | POA: Diagnosis not present

## 2020-09-30 DIAGNOSIS — H0102B Squamous blepharitis left eye, upper and lower eyelids: Secondary | ICD-10-CM | POA: Diagnosis not present

## 2020-09-30 DIAGNOSIS — Z961 Presence of intraocular lens: Secondary | ICD-10-CM | POA: Diagnosis not present

## 2020-09-30 DIAGNOSIS — H0102A Squamous blepharitis right eye, upper and lower eyelids: Secondary | ICD-10-CM | POA: Diagnosis not present

## 2020-09-30 DIAGNOSIS — H353122 Nonexudative age-related macular degeneration, left eye, intermediate dry stage: Secondary | ICD-10-CM | POA: Diagnosis not present

## 2020-10-10 DIAGNOSIS — I129 Hypertensive chronic kidney disease with stage 1 through stage 4 chronic kidney disease, or unspecified chronic kidney disease: Secondary | ICD-10-CM | POA: Diagnosis not present

## 2020-10-17 ENCOUNTER — Ambulatory Visit (INDEPENDENT_AMBULATORY_CARE_PROVIDER_SITE_OTHER): Payer: PPO | Admitting: Ophthalmology

## 2020-10-17 ENCOUNTER — Other Ambulatory Visit: Payer: Self-pay

## 2020-10-17 ENCOUNTER — Encounter (INDEPENDENT_AMBULATORY_CARE_PROVIDER_SITE_OTHER): Payer: Self-pay | Admitting: Ophthalmology

## 2020-10-17 DIAGNOSIS — H353124 Nonexudative age-related macular degeneration, left eye, advanced atrophic with subfoveal involvement: Secondary | ICD-10-CM

## 2020-10-17 DIAGNOSIS — H353211 Exudative age-related macular degeneration, right eye, with active choroidal neovascularization: Secondary | ICD-10-CM

## 2020-10-17 MED ORDER — AFLIBERCEPT 2MG/0.05ML IZ SOLN FOR KALEIDOSCOPE
2.0000 mg | INTRAVITREAL | Status: AC | PRN
  Administered 2020-10-17: 2 mg via INTRAVITREAL

## 2020-10-17 NOTE — Assessment & Plan Note (Signed)
Chronic active subretinal fluid right eye, overall controlled and with preserved visual acuity at 7-week interval today.  Will need repeat injection today and return follow-up in 7 to 8 weeks again right eye

## 2020-10-17 NOTE — Assessment & Plan Note (Signed)
Stable OS, no active CNVM

## 2020-10-17 NOTE — Progress Notes (Signed)
10/17/2020     CHIEF COMPLAINT Patient presents for Retina Follow Up (7 Week Wet AMD f\u OD. Possible Eylea OD. OCT/Pt states vision has decreased. Pt has a harder time reading road signs. Denies floaters and FOL.)   HISTORY OF PRESENT ILLNESS: Robert Doyle is a 87 y.o. male who presents to the clinic today for:   HPI    Retina Follow Up    Patient presents with  Wet AMD.  In right eye.  Severity is moderate.  Duration of 7 weeks.  Since onset it is stable.  I, the attending physician,  performed the HPI with the patient and updated documentation appropriately. Additional comments: 7 Week Wet AMD f\u OD. Possible Eylea OD. OCT Pt states vision has decreased. Pt has a harder time reading road signs. Denies floaters and FOL.       Last edited by Tilda Franco on 10/17/2020  1:10 PM. (History)      Referring physician: Lajean Manes, MD [address] [place],  [place] [address]  HISTORICAL INFORMATION:   Selected notes from the MEDICAL RECORD NUMBER       CURRENT MEDICATIONS: Current Outpatient Medications (Ophthalmic Drugs)  Medication Sig  . latanoprost (XALATAN) 0.005 % ophthalmic solution Place 1 drop into both eyes daily. (Patient not taking: Reported on 02/19/2020)   No current facility-administered medications for this visit. (Ophthalmic Drugs)   Current Outpatient Medications (Other)  Medication Sig  . albuterol (PROAIR HFA) 108 (90 BASE) MCG/ACT inhaler Inhale 2 puffs into the lungs every 4 (four) hours as needed for wheezing or shortness of breath.  Marland Kitchen albuterol (PROVENTIL) (2.5 MG/3ML) 0.083% nebulizer solution Take 2.5 mg by nebulization every 6 (six) hours as needed for wheezing or shortness of breath.  Marland Kitchen albuterol (PROVENTIL) (2.5 MG/3ML) 0.083% nebulizer solution USE 1 VIAL IN NEBULIZER EVERY 6 HOURS AS NEEDED FOR WHEEZING/SHORTNESS OF BREATH  . amLODipine (NORVASC) 10 MG tablet Take 10 mg by mouth daily.  Jearl Klinefelter ELLIPTA 62.5-25 MCG/INH AEPB 1  puff daily.  Marland Kitchen aspirin EC 81 MG tablet Take 81 mg by mouth daily.  Marland Kitchen azithromycin (ZITHROMAX) 250 MG tablet Take 2 tablets today then 1 tablet daily until gone  . Calcium Carb-Cholecalciferol (CALCIUM 600 + D PO) Take 1 tablet by mouth daily.  . Fluticasone Furoate-Vilanterol (BREO ELLIPTA) 100-25 MCG/INH AEPB Inhale 1 puff into the lungs daily.  . hydrochlorothiazide (HYDRODIURIL) 12.5 MG tablet Take 12.5 mg by mouth every morning.  . hydrochlorothiazide (HYDRODIURIL) 25 MG tablet Take 25 mg by mouth every morning.  Marland Kitchen ibuprofen (ADVIL) 800 MG tablet Take 800 mg by mouth 2 (two) times daily.  Marland Kitchen losartan (COZAAR) 50 MG tablet Take 50 mg by mouth daily.   Marland Kitchen lovastatin (MEVACOR) 40 MG tablet Take 40 mg by mouth every morning.   . Multiple Vitamins-Minerals (MULTIVITAMIN PO) Take 1 tablet by mouth daily.  . Multiple Vitamins-Minerals (PRESERVISION AREDS PO) Take 1 tablet by mouth 2 (two) times daily.  . Omega-3 Fatty Acids (FISH OIL) 1200 MG CAPS Take 1,200 mg by mouth daily.   No current facility-administered medications for this visit. (Other)      REVIEW OF SYSTEMS:    ALLERGIES No Known Allergies  PAST MEDICAL HISTORY Past Medical History:  Diagnosis Date  . Allergic rhinitis   . Asthma   . COPD (chronic obstructive pulmonary disease) ([place])   . Hypertension   . Macular degeneration    Past Surgical History:  Procedure Laterality  Date  . CATARACT EXTRACTION W/PHACO Left 11/03/2016   Dr. Katy Fitch  . CATARACT EXTRACTION W/PHACO Right 11/22/2016   Dr. Katy Fitch  . COLONOSCOPY WITH PROPOFOL N/A 03/12/2014   Procedure: COLONOSCOPY WITH PROPOFOL;  Surgeon: Garlan Fair, MD;  Location: [facility];  Service: Endoscopy;  Laterality: N/A;  . LUMBAR SPINE SURGERY     x 2 times  . repair deviated septum  40-50 yrs ago  . ROTATOR CUFF REPAIR Left     FAMILY HISTORY Family History  Problem Relation Age of Onset  . Prostate cancer Father   . Macular degeneration Mother      SOCIAL HISTORY Social History   Tobacco Use  . Smoking status: Former Smoker    Packs/day: 2.00    Years: 15.00    Pack years: 30.00    Types: Cigarettes    Quit date: 07/14/1963    Years since quitting: 57.3  . Smokeless tobacco: Never Used  Substance Use Topics  . Alcohol use: No  . Drug use: No         OPHTHALMIC EXAM:  Base Eye Exam    Visual Acuity (Snellen - Linear)      Right Left   Dist [place] 20/30 -2 20/40 -2   Dist ph [place]  20/30 -1       Tonometry (Tonopen, 1:13 PM)      Right Left   Pressure 8 10       Pupils      Pupils Dark Light Shape React APD   Right PERRL 4 4 Round Minimal None   Left PERRL 4 4 Round Minimal None       Visual Fields (Counting fingers)      Left Right    Full Full       Neuro/Psych    Oriented x3: Yes   Mood/Affect: Normal       Dilation    Both eyes: 1.0% Mydriacyl, 2.5% Phenylephrine @ 1:13 PM        Slit Lamp and Fundus Exam    External Exam      Right Left   External Normal Normal       Slit Lamp Exam      Right Left   Lids/Lashes Normal Normal   Conjunctiva/Sclera White and quiet White and quiet   Cornea Clear Clear   Anterior Chamber Deep and quiet Deep and quiet   Iris Round and reactive Round and reactive   Lens Posterior chamber intraocular lens Posterior chamber intraocular lens   Anterior Vitreous Normal Normal       Fundus Exam      Right Left   Posterior Vitreous Posterior vitreous detachment    Disc Normal    C/D Ratio 0.35    Macula Retinal pigment epithelial mottling, serous macular thickening, no hemorrhage, no exudates, Pigmented atrophy    Vessels Normal, no signs of Hollenhorst plaque seen, as it was noted recent examination on 04/02/2020 and that plaque was present between eye nevus superotemporally and the macula    Periphery Normal, small flat nevus along the superotemporal arcade           IMAGING AND PROCEDURES  Imaging and Procedures for 10/17/20  OCT, Retina - OU - Both  Eyes       Right Eye Quality was good. Scan locations included subfoveal. Central Foveal Thickness: 331. Progression has been stable. Findings include subretinal fluid, disciform scar, choroidal neovascular membrane.   Left Eye Quality was good. Scan locations  included subfoveal. Central Foveal Thickness: 275. Progression has been stable. Findings include abnormal foveal contour, intraretinal hyper-reflective material, subretinal hyper-reflective material.   Notes OD, improved overall CNVM with less subretinal fluid yet persistent through the FAZ, at 7-week interval.  We will extend interval next to 7 weeks and and will tolerate subretinal fluid if no impact on acuity and not worsening next  No signs of active CNVM OS  OD with persistent subretinal fluid stable at 7-week follow-up         Intravitreal Injection, Pharmacologic Agent - OD - Right Eye       Time Out 10/17/2020. 1:32 PM. Confirmed correct patient, procedure, site, and patient consented.   Anesthesia Topical anesthesia was used. Anesthetic medications included Akten 3.5%.   Procedure Preparation included Tobramycin 0.3%, 10% betadine to eyelids, 5% betadine to ocular surface, Ofloxacin . A 30 gauge needle was used.   Injection:  2 mg aflibercept Alfonse Flavors) SOLN   NDC: A3590391, Lot: [id number]   Route: Intravitreal, Site: Right Eye, Waste: 0 mg  Post-op Post injection exam found visual acuity of at least counting fingers. The patient tolerated the procedure well. There were no complications. The patient received written and verbal post procedure care education. Post injection medications were not given.                 ASSESSMENT/PLAN:  Advanced nonexudative age-related macular degeneration of left eye with subfoveal involvement Stable OS, no active CNVM  Exudative age-related macular degeneration of right eye with active choroidal neovascularization (HCC) Chronic active subretinal fluid right eye,  overall controlled and with preserved visual acuity at 7-week interval today.  Will need repeat injection today and return follow-up in 7 to 8 weeks again right eye      ICD-10-CM   1. Exudative age-related macular degeneration of right eye with active choroidal neovascularization (HCC)  H35.3211 OCT, Retina - OU - Both Eyes    Intravitreal Injection, Pharmacologic Agent - OD - Right Eye    aflibercept (EYLEA) SOLN 2 mg  2. Advanced nonexudative age-related macular degeneration of left eye with subfoveal involvement  H35.3124     1.  Repeat intravitreal Eylea today at 7-week interval.  Follow-up in 7 to 8 weeks dilate right eye again  2.  3.  Ophthalmic Meds Ordered this visit:  Meds ordered this encounter  Medications  . aflibercept (EYLEA) SOLN 2 mg       Return for RV 7 to 8 weeks, dilate, OD, EYLEA OCT.  There are no Patient Instructions on file for this visit.   Explained the diagnoses, plan, and follow up with the patient and they expressed understanding.  Patient expressed understanding of the importance of proper follow up care.   Clent Demark Alphia Behanna M.D. Diseases & Surgery of the Retina and Vitreous Retina & Diabetic [place] 10/17/20     Abbreviations: M myopia (nearsighted); A astigmatism; H hyperopia (farsighted); P presbyopia; Mrx spectacle prescription;  CTL contact lenses; OD right eye; OS left eye; OU both eyes  XT exotropia; ET esotropia; PEK punctate epithelial keratitis; PEE punctate epithelial erosions; DES dry eye syndrome; MGD meibomian gland dysfunction; ATs artificial tears; PFAT's preservative free artificial tears; [place] nuclear sclerotic cataract; PSC posterior subcapsular cataract; ERM epi-retinal membrane; PVD posterior vitreous detachment; RD retinal detachment; DM diabetes mellitus; DR diabetic retinopathy; NPDR non-proliferative diabetic retinopathy; PDR proliferative diabetic retinopathy; CSME clinically significant macular edema; DME diabetic  macular edema; dbh dot blot hemorrhages; CWS cotton wool spot; POAG primary  open angle glaucoma; C/D cup-to-disc ratio; HVF humphrey visual field; GVF goldmann visual field; OCT optical coherence tomography; IOP intraocular pressure; BRVO Branch retinal vein occlusion; CRVO central retinal vein occlusion; CRAO central retinal artery occlusion; BRAO branch retinal artery occlusion; RT retinal tear; SB scleral buckle; PPV pars plana vitrectomy; VH Vitreous hemorrhage; PRP panretinal laser photocoagulation; IVK intravitreal kenalog; VMT vitreomacular traction; MH Macular hole;  NVD neovascularization of the disc; NVE neovascularization elsewhere; AREDS age related eye disease study; ARMD age related macular degeneration; POAG primary open angle glaucoma; EBMD epithelial/anterior basement membrane dystrophy; ACIOL anterior chamber intraocular lens; IOL intraocular lens; PCIOL posterior chamber intraocular lens; Phaco/IOL phacoemulsification with intraocular lens placement; [place] photorefractive keratectomy; LASIK laser assisted in situ keratomileusis; HTN hypertension; DM diabetes mellitus; COPD chronic obstructive pulmonary disease

## 2020-10-22 DIAGNOSIS — N1831 Chronic kidney disease, stage 3a: Secondary | ICD-10-CM | POA: Diagnosis not present

## 2020-10-22 DIAGNOSIS — J4541 Moderate persistent asthma with (acute) exacerbation: Secondary | ICD-10-CM | POA: Diagnosis not present

## 2020-10-22 DIAGNOSIS — J449 Chronic obstructive pulmonary disease, unspecified: Secondary | ICD-10-CM | POA: Diagnosis not present

## 2020-10-22 DIAGNOSIS — E782 Mixed hyperlipidemia: Secondary | ICD-10-CM | POA: Diagnosis not present

## 2020-10-22 DIAGNOSIS — J45909 Unspecified asthma, uncomplicated: Secondary | ICD-10-CM | POA: Diagnosis not present

## 2020-10-22 DIAGNOSIS — I129 Hypertensive chronic kidney disease with stage 1 through stage 4 chronic kidney disease, or unspecified chronic kidney disease: Secondary | ICD-10-CM | POA: Diagnosis not present

## 2020-10-28 DIAGNOSIS — I129 Hypertensive chronic kidney disease with stage 1 through stage 4 chronic kidney disease, or unspecified chronic kidney disease: Secondary | ICD-10-CM | POA: Diagnosis not present

## 2020-10-28 DIAGNOSIS — N1831 Chronic kidney disease, stage 3a: Secondary | ICD-10-CM | POA: Diagnosis not present

## 2020-10-28 DIAGNOSIS — J4541 Moderate persistent asthma with (acute) exacerbation: Secondary | ICD-10-CM | POA: Diagnosis not present

## 2020-10-28 DIAGNOSIS — J449 Chronic obstructive pulmonary disease, unspecified: Secondary | ICD-10-CM | POA: Diagnosis not present

## 2020-11-08 DIAGNOSIS — N1831 Chronic kidney disease, stage 3a: Secondary | ICD-10-CM | POA: Diagnosis not present

## 2020-11-08 DIAGNOSIS — I129 Hypertensive chronic kidney disease with stage 1 through stage 4 chronic kidney disease, or unspecified chronic kidney disease: Secondary | ICD-10-CM | POA: Diagnosis not present

## 2020-12-10 DIAGNOSIS — N1831 Chronic kidney disease, stage 3a: Secondary | ICD-10-CM | POA: Diagnosis not present

## 2020-12-10 DIAGNOSIS — I129 Hypertensive chronic kidney disease with stage 1 through stage 4 chronic kidney disease, or unspecified chronic kidney disease: Secondary | ICD-10-CM | POA: Diagnosis not present

## 2020-12-12 ENCOUNTER — Encounter (INDEPENDENT_AMBULATORY_CARE_PROVIDER_SITE_OTHER): Payer: Self-pay | Admitting: Ophthalmology

## 2020-12-12 ENCOUNTER — Ambulatory Visit (INDEPENDENT_AMBULATORY_CARE_PROVIDER_SITE_OTHER): Payer: PPO | Admitting: Ophthalmology

## 2020-12-12 ENCOUNTER — Other Ambulatory Visit: Payer: Self-pay

## 2020-12-12 DIAGNOSIS — H353211 Exudative age-related macular degeneration, right eye, with active choroidal neovascularization: Secondary | ICD-10-CM | POA: Diagnosis not present

## 2020-12-12 DIAGNOSIS — H35721 Serous detachment of retinal pigment epithelium, right eye: Secondary | ICD-10-CM | POA: Diagnosis not present

## 2020-12-12 MED ORDER — AFLIBERCEPT 2MG/0.05ML IZ SOLN FOR KALEIDOSCOPE
2.0000 mg | INTRAVITREAL | Status: AC | PRN
  Administered 2020-12-12: 2 mg via INTRAVITREAL

## 2020-12-12 NOTE — Progress Notes (Signed)
12/12/2020     CHIEF COMPLAINT Patient presents for Retina Follow Up (8 Wk F/U OD, poss Eylea OD//Pt denies noticeable changes to [place] OU since last visit. Pt denies ocular pain, flashes of light, or floaters OU. //) and Macular Degeneration   HISTORY OF PRESENT ILLNESS: Robert Doyle is a 87 y.o. male who presents to the clinic today for:   HPI    Retina Follow Up    Diagnosis: Wet AMD   Laterality: right eye   Onset: 8 weeks ago   Severity: mild   Duration: 8 weeks   Course: stable   Comments: 8 Wk F/U OD, poss Eylea OD  Pt denies noticeable changes to [place] OU since last visit. Pt denies ocular pain, flashes of light, or floaters OU.             Comments    No change in visual distortion, lying still straight in the right eye.       Last edited by Hurman Horn, MD on 12/12/2020  3:07 PM. (History)      Referring physician: Lajean Manes, MD [address],  [place] [address]  HISTORICAL INFORMATION:   Selected notes from the MEDICAL RECORD NUMBER       CURRENT MEDICATIONS: Current Outpatient Medications (Ophthalmic Drugs)  Medication Sig  . latanoprost (XALATAN) 0.005 % ophthalmic solution Place 1 drop into both eyes daily. (Patient not taking: No sig reported)   No current facility-administered medications for this visit. (Ophthalmic Drugs)   Current Outpatient Medications (Other)  Medication Sig  . albuterol (PROAIR HFA) 108 (90 BASE) MCG/ACT inhaler Inhale 2 puffs into the lungs every 4 (four) hours as needed for wheezing or shortness of breath.  Marland Kitchen albuterol (PROVENTIL) (2.5 MG/3ML) 0.083% nebulizer solution Take 2.5 mg by nebulization every 6 (six) hours as needed for wheezing or shortness of breath.  Marland Kitchen albuterol (PROVENTIL) (2.5 MG/3ML) 0.083% nebulizer solution USE 1 VIAL IN NEBULIZER EVERY 6 HOURS AS NEEDED FOR WHEEZING/SHORTNESS OF BREATH  . amLODipine (NORVASC) 10 MG tablet Take 10 mg by mouth daily.  Jearl Klinefelter ELLIPTA 62.5-25 MCG/INH AEPB 1 puff  daily.  Marland Kitchen aspirin EC 81 MG tablet Take 81 mg by mouth daily.  Marland Kitchen azithromycin (ZITHROMAX) 250 MG tablet Take 2 tablets today then 1 tablet daily until gone  . Calcium Carb-Cholecalciferol (CALCIUM 600 + D PO) Take 1 tablet by mouth daily.  . Fluticasone Furoate-Vilanterol (BREO ELLIPTA) 100-25 MCG/INH AEPB Inhale 1 puff into the lungs daily.  . hydrochlorothiazide (HYDRODIURIL) 12.5 MG tablet Take 12.5 mg by mouth every morning.  . hydrochlorothiazide (HYDRODIURIL) 25 MG tablet Take 25 mg by mouth every morning.  Marland Kitchen ibuprofen (ADVIL) 800 MG tablet Take 800 mg by mouth 2 (two) times daily.  Marland Kitchen losartan (COZAAR) 50 MG tablet Take 50 mg by mouth daily.   Marland Kitchen lovastatin (MEVACOR) 40 MG tablet Take 40 mg by mouth every morning.   . Multiple Vitamins-Minerals (MULTIVITAMIN PO) Take 1 tablet by mouth daily.  . Multiple Vitamins-Minerals (PRESERVISION AREDS PO) Take 1 tablet by mouth 2 (two) times daily.  . Omega-3 Fatty Acids (FISH OIL) 1200 MG CAPS Take 1,200 mg by mouth daily.   No current facility-administered medications for this visit. (Other)      REVIEW OF SYSTEMS:    ALLERGIES No Known Allergies  PAST MEDICAL HISTORY Past Medical History:  Diagnosis Date  . Allergic rhinitis   . Asthma   . COPD (chronic obstructive pulmonary disease) (  [place])   . Hypertension   . Macular degeneration    Past Surgical History:  Procedure Laterality Date  . CATARACT EXTRACTION W/PHACO Left 11/03/2016   Dr. Katy Fitch  . CATARACT EXTRACTION W/PHACO Right 11/22/2016   Dr. Katy Fitch  . COLONOSCOPY WITH PROPOFOL N/A 03/12/2014   Procedure: COLONOSCOPY WITH PROPOFOL;  Surgeon: Garlan Fair, MD;  Location: [facility];  Service: Endoscopy;  Laterality: N/A;  . LUMBAR SPINE SURGERY     x 2 times  . repair deviated septum  40-50 yrs ago  . ROTATOR CUFF REPAIR Left     FAMILY HISTORY Family History  Problem Relation Age of Onset  . Prostate cancer Father   . Macular degeneration Mother     SOCIAL  HISTORY Social History   Tobacco Use  . Smoking status: Former Smoker    Packs/day: 2.00    Years: 15.00    Pack years: 30.00    Types: Cigarettes    Quit date: 07/14/1963    Years since quitting: 57.4  . Smokeless tobacco: Never Used  Substance Use Topics  . Alcohol use: No  . Drug use: No         OPHTHALMIC EXAM: Base Eye Exam    Visual Acuity (ETDRS)      Right Left   Dist [place] 20/25 20/30 +2   Dist ph [place]  NI       Tonometry (Tonopen, 2:30 PM)      Right Left   Pressure 10 10       Pupils      Pupils Dark Light Shape React APD   Right PERRL 5 4 Round Slow None   Left PERRL 5 4 Round Slow None       Visual Fields (Counting fingers)      Left Right    Full Full       Extraocular Movement      Right Left    Full Full       Neuro/Psych    Oriented x3: Yes   Mood/Affect: Normal       Dilation    Right eye: 1.0% Mydriacyl, 2.5% Phenylephrine @ 2:30 PM        Slit Lamp and Fundus Exam    External Exam      Right Left   External Normal Normal       Slit Lamp Exam      Right Left   Lids/Lashes Normal Normal   Conjunctiva/Sclera White and quiet White and quiet   Cornea Clear Clear   Anterior Chamber Deep and quiet Deep and quiet   Iris Round and reactive Round and reactive   Lens Posterior chamber intraocular lens Posterior chamber intraocular lens   Anterior Vitreous Normal Normal       Fundus Exam      Right Left   Posterior Vitreous Posterior vitreous detachment    Disc Normal    C/D Ratio 0.35    Macula Retinal pigment epithelial mottling, serous macular thickening, no hemorrhage, no exudates, Pigmented atrophy    Vessels Normal, no signs of Hollenhorst plaque seen, as it was noted recent examination on 04/02/2020 and that plaque was present between eye nevus superotemporally and the macula    Periphery Normal, small flat nevus along the superotemporal arcade           IMAGING AND PROCEDURES  Imaging and Procedures for 12/12/20  OCT,  Retina - OU - Both Eyes       Right  Eye Quality was good. Scan locations included subfoveal. Central Foveal Thickness: 329. Progression has been stable. Findings include subretinal fluid, disciform scar, choroidal neovascular membrane.   Left Eye Quality was good. Scan locations included subfoveal. Central Foveal Thickness: 272. Progression has been stable. Findings include abnormal foveal contour, intraretinal hyper-reflective material, subretinal hyper-reflective material.   Notes OD, improved overall CNVM with less subretinal fluid yet persistent through the FAZ, at 8-week interval.  We will maintain interval next to 8 weeks and and will tolerate subretinal fluid if no impact on acuity and not worsening next  No signs of active CNVM OS  OD with persistent subretinal fluid stable at 8-week follow-up         Intravitreal Injection, Pharmacologic Agent - OD - Right Eye       Time Out 12/12/2020. 3:09 PM. Confirmed correct patient, procedure, site, and patient consented.   Anesthesia Topical anesthesia was used. Anesthetic medications included [place] 3.5%.   Procedure Preparation included Tobramycin 0.3%, 10% betadine to eyelids, 5% betadine to ocular surface. A 30 gauge needle was used.   Injection:  2 mg aflibercept Alfonse Flavors) SOLN   NDC: [id number], Lot: [id number]   Route: Intravitreal, Site: Right Eye, Waste: 0 mg  Post-op Post injection exam found visual acuity of at least counting fingers. The patient tolerated the procedure well. There were no complications. The patient received written and verbal post procedure care education. Post injection medications were not given.                 ASSESSMENT/PLAN:  Exudative age-related macular degeneration of right eye with active choroidal neovascularization ([place]) With chronic persistent serous retinal detachment is subfoveal location with preservation of good acuity.  Slightly less today and maintained at 8-week  interval.  We will repeat intravitreal Eylea today to maintain an examination next again in right IN 8 weeks  Serous detachment of retinal pigment epithelium of right eye Stable or slightly improved on therapy for wet AMD      ICD-10-CM   1. Exudative age-related macular degeneration of right eye with active choroidal neovascularization (HCC)  H35.3211 OCT, Retina - OU - Both Eyes    Intravitreal Injection, Pharmacologic Agent - OD - Right Eye    aflibercept (EYLEA) SOLN 2 mg  2. Serous detachment of retinal pigment epithelium of right eye  H35.721     1.  OD with stable visual acuity at 8-week follow-up today on intravitreal Avastin with chronic serous detachment relative to and related to wet AMD OD  2.  Repeat injection today and dilate next visit OD, OS possible Eylea OD  3.  Ophthalmic Meds Ordered this visit:  Meds ordered this encounter  Medications  . aflibercept (EYLEA) SOLN 2 mg       Return in about 8 weeks (around 02/06/2021) for DILATE OU, EYLEA OCT, OD.  There are no Patient Instructions on file for this visit.   Explained the diagnoses, plan, and follow up with the patient and they expressed understanding.  Patient expressed understanding of the importance of proper follow up care.   Clent Demark Monie Shere M.D. Diseases & Surgery of the Retina and Vitreous [facility] [place] 12/12/20     Abbreviations: M myopia (nearsighted); A astigmatism; H hyperopia (farsighted); P presbyopia; Mrx spectacle prescription;  CTL contact lenses; OD right eye; OS left eye; OU both eyes  XT exotropia; ET esotropia; PEK punctate epithelial keratitis; PEE punctate epithelial erosions; DES dry eye syndrome; MGD meibomian gland dysfunction;  ATs artificial tears; PFAT's preservative free artificial tears; [place] nuclear sclerotic cataract; PSC posterior subcapsular cataract; ERM epi-retinal membrane; PVD posterior vitreous detachment; RD retinal detachment; DM diabetes mellitus; DR  diabetic retinopathy; NPDR non-proliferative diabetic retinopathy; PDR proliferative diabetic retinopathy; CSME clinically significant macular edema; DME diabetic macular edema; dbh dot blot hemorrhages; CWS cotton wool spot; POAG primary open angle glaucoma; C/D cup-to-disc ratio; HVF humphrey visual field; GVF goldmann visual field; OCT optical coherence tomography; IOP intraocular pressure; BRVO Branch retinal vein occlusion; CRVO central retinal vein occlusion; CRAO central retinal artery occlusion; BRAO branch retinal artery occlusion; RT retinal tear; SB scleral buckle; PPV pars plana vitrectomy; VH Vitreous hemorrhage; PRP panretinal laser photocoagulation; IVK intravitreal kenalog; VMT vitreomacular traction; MH Macular hole;  NVD neovascularization of the disc; NVE neovascularization elsewhere; AREDS age related eye disease study; ARMD age related macular degeneration; POAG primary open angle glaucoma; EBMD epithelial/anterior basement membrane dystrophy; ACIOL anterior chamber intraocular lens; IOL intraocular lens; PCIOL posterior chamber intraocular lens; Phaco/IOL phacoemulsification with intraocular lens placement; [place] photorefractive keratectomy; LASIK laser assisted in situ keratomileusis; HTN hypertension; DM diabetes mellitus; COPD chronic obstructive pulmonary disease

## 2020-12-12 NOTE — Assessment & Plan Note (Signed)
Stable or slightly improved on therapy for wet AMD

## 2020-12-12 NOTE — Assessment & Plan Note (Signed)
With chronic persistent serous retinal detachment is subfoveal location with preservation of good acuity.  Slightly less today and maintained at 8-week interval.  We will repeat intravitreal Eylea today to maintain an examination next again in right IN 8 weeks

## 2020-12-24 DIAGNOSIS — N62 Hypertrophy of breast: Secondary | ICD-10-CM | POA: Diagnosis not present

## 2020-12-24 DIAGNOSIS — N1831 Chronic kidney disease, stage 3a: Secondary | ICD-10-CM | POA: Diagnosis not present

## 2020-12-24 DIAGNOSIS — I129 Hypertensive chronic kidney disease with stage 1 through stage 4 chronic kidney disease, or unspecified chronic kidney disease: Secondary | ICD-10-CM | POA: Diagnosis not present

## 2020-12-24 DIAGNOSIS — J449 Chronic obstructive pulmonary disease, unspecified: Secondary | ICD-10-CM | POA: Diagnosis not present

## 2020-12-25 DIAGNOSIS — E782 Mixed hyperlipidemia: Secondary | ICD-10-CM | POA: Diagnosis not present

## 2020-12-25 DIAGNOSIS — J4541 Moderate persistent asthma with (acute) exacerbation: Secondary | ICD-10-CM | POA: Diagnosis not present

## 2020-12-25 DIAGNOSIS — I129 Hypertensive chronic kidney disease with stage 1 through stage 4 chronic kidney disease, or unspecified chronic kidney disease: Secondary | ICD-10-CM | POA: Diagnosis not present

## 2020-12-25 DIAGNOSIS — N1831 Chronic kidney disease, stage 3a: Secondary | ICD-10-CM | POA: Diagnosis not present

## 2020-12-25 DIAGNOSIS — J45909 Unspecified asthma, uncomplicated: Secondary | ICD-10-CM | POA: Diagnosis not present

## 2020-12-25 DIAGNOSIS — J449 Chronic obstructive pulmonary disease, unspecified: Secondary | ICD-10-CM | POA: Diagnosis not present

## 2021-01-03 DIAGNOSIS — J9801 Acute bronchospasm: Secondary | ICD-10-CM | POA: Diagnosis not present

## 2021-01-03 DIAGNOSIS — J209 Acute bronchitis, unspecified: Secondary | ICD-10-CM | POA: Diagnosis not present

## 2021-01-03 DIAGNOSIS — R52 Pain, unspecified: Secondary | ICD-10-CM | POA: Diagnosis not present

## 2021-01-09 DIAGNOSIS — I129 Hypertensive chronic kidney disease with stage 1 through stage 4 chronic kidney disease, or unspecified chronic kidney disease: Secondary | ICD-10-CM | POA: Diagnosis not present

## 2021-01-22 DIAGNOSIS — E782 Mixed hyperlipidemia: Secondary | ICD-10-CM | POA: Diagnosis not present

## 2021-01-22 DIAGNOSIS — I129 Hypertensive chronic kidney disease with stage 1 through stage 4 chronic kidney disease, or unspecified chronic kidney disease: Secondary | ICD-10-CM | POA: Diagnosis not present

## 2021-01-22 DIAGNOSIS — J45909 Unspecified asthma, uncomplicated: Secondary | ICD-10-CM | POA: Diagnosis not present

## 2021-01-22 DIAGNOSIS — J4541 Moderate persistent asthma with (acute) exacerbation: Secondary | ICD-10-CM | POA: Diagnosis not present

## 2021-01-22 DIAGNOSIS — J449 Chronic obstructive pulmonary disease, unspecified: Secondary | ICD-10-CM | POA: Diagnosis not present

## 2021-01-22 DIAGNOSIS — N1831 Chronic kidney disease, stage 3a: Secondary | ICD-10-CM | POA: Diagnosis not present

## 2021-02-04 ENCOUNTER — Encounter (INDEPENDENT_AMBULATORY_CARE_PROVIDER_SITE_OTHER): Payer: PPO | Admitting: Ophthalmology

## 2021-02-05 ENCOUNTER — Encounter (INDEPENDENT_AMBULATORY_CARE_PROVIDER_SITE_OTHER): Payer: Self-pay | Admitting: Ophthalmology

## 2021-02-05 ENCOUNTER — Other Ambulatory Visit: Payer: Self-pay

## 2021-02-05 ENCOUNTER — Ambulatory Visit (INDEPENDENT_AMBULATORY_CARE_PROVIDER_SITE_OTHER): Payer: PPO | Admitting: Ophthalmology

## 2021-02-05 DIAGNOSIS — H353124 Nonexudative age-related macular degeneration, left eye, advanced atrophic with subfoveal involvement: Secondary | ICD-10-CM | POA: Diagnosis not present

## 2021-02-05 DIAGNOSIS — H353211 Exudative age-related macular degeneration, right eye, with active choroidal neovascularization: Secondary | ICD-10-CM

## 2021-02-05 MED ORDER — AFLIBERCEPT 2MG/0.05ML IZ SOLN FOR KALEIDOSCOPE
2.0000 mg | INTRAVITREAL | Status: AC | PRN
  Administered 2021-02-05: 2 mg via INTRAVITREAL

## 2021-02-05 NOTE — Assessment & Plan Note (Signed)
The nature of wet macular degeneration was discussed with the patient.  Forms of therapy reviewed include the use of Anti-VEGF medications injected painlessly into the eye, as well as other possible treatment modalities, including thermal laser therapy. Fellow eye involvement and risks were discussed with the patient. Upon the finding of wet age related macular degeneration, treatment will be offered. The treatment regimen is on a treat as needed basis with the intent to treat if necessary and extend interval of exams when possible. On average 1 out of 6 patients do not need lifetime therapy. However, the risk of recurrent disease is high for a lifetime.  Initially monthly, then periodic, examinations and evaluations will determine whether the next treatment is required on the day of the examination.  Persistent pigment epithelial detachment with small amount of subretinal fluid overlying the fovea yet with preserved acuity on intravitreal Eylea.  We will repeat Eylea injection today to maintain

## 2021-02-05 NOTE — Progress Notes (Signed)
02/05/2021     CHIEF COMPLAINT Patient presents for Retina Follow Up (8 week fu OU and Eylea OD/Pt states, "I am having more trouble seeing and I did not pass my test for my license at the [facility]. I have an appointment to see Dr. Katy Fitch to see if glasses will help."/)   HISTORY OF PRESENT ILLNESS: Robert Doyle is a 87 y.o. male who presents to the clinic today for:   HPI     Retina Follow Up           Diagnosis: Wet AMD   Laterality: right eye   Onset: 8 weeks ago   Severity: mild   Duration: 8 weeks   Course: gradually worsening   Comments: 8 week fu OU and Eylea OD Pt states, "I am having more trouble seeing and I did not pass my test for my license at the [facility]. I have an appointment to see Dr. Katy Fitch to see if glasses will help."        Last edited by Kendra Opitz, COA on 02/05/2021  9:17 AM.      Referring physician: Lajean Manes, MD [address] [place],  [place] [address]  HISTORICAL INFORMATION:   Selected notes from the MEDICAL RECORD NUMBER       CURRENT MEDICATIONS: Current Outpatient Medications (Ophthalmic Drugs)  Medication Sig   latanoprost (XALATAN) 0.005 % ophthalmic solution Place 1 drop into both eyes daily. (Patient not taking: No sig reported)   No current facility-administered medications for this visit. (Ophthalmic Drugs)   Current Outpatient Medications (Other)  Medication Sig   albuterol (PROAIR HFA) 108 (90 BASE) MCG/ACT inhaler Inhale 2 puffs into the lungs every 4 (four) hours as needed for wheezing or shortness of breath.   albuterol (PROVENTIL) (2.5 MG/3ML) 0.083% nebulizer solution Take 2.5 mg by nebulization every 6 (six) hours as needed for wheezing or shortness of breath.   albuterol (PROVENTIL) (2.5 MG/3ML) 0.083% nebulizer solution USE 1 VIAL IN NEBULIZER EVERY 6 HOURS AS NEEDED FOR WHEEZING/SHORTNESS OF BREATH   amLODipine (NORVASC) 10 MG tablet Take 10 mg by mouth daily.   ANORO ELLIPTA 62.5-25 MCG/INH AEPB 1  puff daily.   aspirin EC 81 MG tablet Take 81 mg by mouth daily.   azithromycin (ZITHROMAX) 250 MG tablet Take 2 tablets today then 1 tablet daily until gone   Calcium Carb-Cholecalciferol (CALCIUM 600 + D PO) Take 1 tablet by mouth daily.   Fluticasone Furoate-Vilanterol (BREO ELLIPTA) 100-25 MCG/INH AEPB Inhale 1 puff into the lungs daily.   hydrochlorothiazide (HYDRODIURIL) 12.5 MG tablet Take 12.5 mg by mouth every morning.   hydrochlorothiazide (HYDRODIURIL) 25 MG tablet Take 25 mg by mouth every morning.   ibuprofen (ADVIL) 800 MG tablet Take 800 mg by mouth 2 (two) times daily.   losartan (COZAAR) 50 MG tablet Take 50 mg by mouth daily.    lovastatin (MEVACOR) 40 MG tablet Take 40 mg by mouth every morning.    Multiple Vitamins-Minerals (MULTIVITAMIN PO) Take 1 tablet by mouth daily.   Multiple Vitamins-Minerals (PRESERVISION AREDS PO) Take 1 tablet by mouth 2 (two) times daily.   Omega-3 Fatty Acids (FISH OIL) 1200 MG CAPS Take 1,200 mg by mouth daily.   No current facility-administered medications for this visit. (Other)      REVIEW OF SYSTEMS:    ALLERGIES No Known Allergies  PAST MEDICAL HISTORY Past Medical History:  Diagnosis Date   Allergic rhinitis    Asthma  COPD (chronic obstructive pulmonary disease) (HCC)    Hypertension    Macular degeneration    Past Surgical History:  Procedure Laterality Date   CATARACT EXTRACTION W/PHACO Left 11/03/2016   Dr. Katy Fitch   CATARACT EXTRACTION W/PHACO Right 11/22/2016   Dr. Katy Fitch   COLONOSCOPY WITH PROPOFOL N/A 03/12/2014   Procedure: COLONOSCOPY WITH PROPOFOL;  Surgeon: Garlan Fair, MD;  Location: [facility];  Service: Endoscopy;  Laterality: N/A;   LUMBAR SPINE SURGERY     x 2 times   repair deviated septum  40-50 yrs ago   ROTATOR CUFF REPAIR Left     FAMILY HISTORY Family History  Problem Relation Age of Onset   Prostate cancer Father    Macular degeneration Mother     SOCIAL HISTORY Social History    Tobacco Use   Smoking status: Former    Packs/day: 2.00    Years: 15.00    Pack years: 30.00    Types: Cigarettes    Quit date: 07/14/1963    Years since quitting: 57.6   Smokeless tobacco: Never  Substance Use Topics   Alcohol use: No   Drug use: No         OPHTHALMIC EXAM:  Base Eye Exam     Visual Acuity (ETDRS)       Right Left   Dist [place] 20/70 20/40   Dist ph [place] 20/30 -2 20/30         Tonometry (Tonopen, 9:21 AM)       Right Left   Pressure 10 11         Pupils       Pupils Dark Light Shape React APD   Right PERRL 5 4 Round Slow None   Left PERRL 5 4 Round Slow None         Visual Fields       Left Right    Full Full         Extraocular Movement       Right Left    Full Full         Neuro/Psych     Oriented x3: Yes   Mood/Affect: Normal         Dilation     Both eyes: 1.0% Mydriacyl, 2.5% Phenylephrine @ 9:21 AM           Slit Lamp and Fundus Exam     External Exam       Right Left   External Normal Normal         Slit Lamp Exam       Right Left   Lids/Lashes Normal Normal   Conjunctiva/Sclera White and quiet White and quiet   Cornea Clear Clear   Anterior Chamber Deep and quiet Deep and quiet   Iris Round and reactive Round and reactive   Lens Posterior chamber intraocular lens Posterior chamber intraocular lens   Anterior Vitreous Normal Normal         Fundus Exam       Right Left   Posterior Vitreous Posterior vitreous detachment Posterior vitreous detachment   Disc Normal Normal   C/D Ratio 0.4 0.55   Macula Retinal pigment epithelial mottling, serous macular thickening, no hemorrhage, no exudates, Pigmented atrophy Intermediate age related macular degeneration, no hemorrhage, Retinal pigment epithelial mottling   Vessels Normal, no signs of Hollenhorst plaque seen, as it was noted recent examination on 04/02/2020 and that plaque was present between eye nevus superotemporally and the macula Normal  Periphery Normal, small flat nevus along the superotemporal arcade Normal            IMAGING AND PROCEDURES  Imaging and Procedures for 02/05/21  OCT, Retina - OU - Both Eyes       Right Eye Quality was good. Scan locations included subfoveal. Central Foveal Thickness: 335. Progression has been stable. Findings include subretinal fluid, disciform scar, choroidal neovascular membrane.   Left Eye Quality was good. Scan locations included subfoveal. Central Foveal Thickness: 270. Progression has been stable. Findings include abnormal foveal contour, intraretinal hyper-reflective material, subretinal hyper-reflective material.   Notes OD, improved overall CNVM with less subretinal fluid yet persistent through the FAZ, at 8-week interval.  We will maintain interval next to 8 weeks and and will tolerate subretinal fluid if no impact on acuity and not worsening next  No signs of active CNVM OS  OD with persistent subretinal fluid stable at 8-week follow-up       Intravitreal Injection, Pharmacologic Agent - OD - Right Eye       Time Out 02/05/2021. 9:48 AM. Confirmed correct patient, procedure, site, and patient consented.   Anesthesia Topical anesthesia was used. Anesthetic medications included Akten 3.5%.   Procedure Preparation included Tobramycin 0.3%, 10% betadine to eyelids, 5% betadine to ocular surface. A 30 gauge needle was used.   Injection: 2 mg aflibercept 2 MG/0.05ML   Route: Intravitreal, Site: Right Eye   NDC: [id number], Lot: [id number], Waste: 0 mL   Post-op Post injection exam found visual acuity of at least counting fingers. The patient tolerated the procedure well. There were no complications. The patient received written and verbal post procedure care education. Post injection medications were not given.              ASSESSMENT/PLAN:  Exudative age-related macular degeneration of right eye with active choroidal neovascularization  (HCC) The nature of wet macular degeneration was discussed with the patient.  Forms of therapy reviewed include the use of Anti-VEGF medications injected painlessly into the eye, as well as other possible treatment modalities, including thermal laser therapy. Fellow eye involvement and risks were discussed with the patient. Upon the finding of wet age related macular degeneration, treatment will be offered. The treatment regimen is on a treat as needed basis with the intent to treat if necessary and extend interval of exams when possible. On average 1 out of 6 patients do not need lifetime therapy. However, the risk of recurrent disease is high for a lifetime.  Initially monthly, then periodic, examinations and evaluations will determine whether the next treatment is required on the day of the examination.  Persistent pigment epithelial detachment with small amount of subretinal fluid overlying the fovea yet with preserved acuity on intravitreal Eylea.  We will repeat Eylea injection today to maintain  Advanced nonexudative age-related macular degeneration of left eye with subfoveal involvement No signs of active CNVM OS today     ICD-10-CM   1. Exudative age-related macular degeneration of right eye with active choroidal neovascularization (HCC)  H35.3211 OCT, Retina - OU - Both Eyes    Intravitreal Injection, Pharmacologic Agent - OD - Right Eye    aflibercept (EYLEA) SOLN 2 mg    2. Advanced nonexudative age-related macular degeneration of left eye with subfoveal involvement  H35.3124       1.  OD, chronic active vascularized pigment epithelial detachment and subretinal fluid with stable acuity while using intravitreal Eylea at 8-week interval.  Repeat injection today  2.  Dilate OD next in 8 weeks possible Eylea  3.  OS no signs of CNVM formation  Ophthalmic Meds Ordered this visit:  Meds ordered this encounter  Medications   aflibercept (EYLEA) SOLN 2 mg       Return in about 8  weeks (around 04/02/2021) for dilate, OD, EYLEA OCT.  There are no Patient Instructions on file for this visit.   Explained the diagnoses, plan, and follow up with the patient and they expressed understanding.  Patient expressed understanding of the importance of proper follow up care.   Clent Demark Icarus Partch M.D. Diseases & Surgery of the Retina and Vitreous [facility] [place] 02/05/21     Abbreviations: M myopia (nearsighted); A astigmatism; H hyperopia (farsighted); P presbyopia; Mrx spectacle prescription;  CTL contact lenses; OD right eye; OS left eye; OU both eyes  XT exotropia; ET esotropia; PEK punctate epithelial keratitis; PEE punctate epithelial erosions; DES dry eye syndrome; MGD meibomian gland dysfunction; ATs artificial tears; PFAT's preservative free artificial tears; [place] nuclear sclerotic cataract; PSC posterior subcapsular cataract; ERM epi-retinal membrane; PVD posterior vitreous detachment; RD retinal detachment; DM diabetes mellitus; DR diabetic retinopathy; NPDR non-proliferative diabetic retinopathy; PDR proliferative diabetic retinopathy; CSME clinically significant macular edema; DME diabetic macular edema; dbh dot blot hemorrhages; CWS cotton wool spot; POAG primary open angle glaucoma; C/D cup-to-disc ratio; HVF humphrey visual field; GVF goldmann visual field; OCT optical coherence tomography; IOP intraocular pressure; BRVO Branch retinal vein occlusion; CRVO central retinal vein occlusion; CRAO central retinal artery occlusion; BRAO branch retinal artery occlusion; RT retinal tear; SB scleral buckle; PPV pars plana vitrectomy; VH Vitreous hemorrhage; PRP panretinal laser photocoagulation; IVK intravitreal kenalog; VMT vitreomacular traction; MH Macular hole;  NVD neovascularization of the disc; NVE neovascularization elsewhere; AREDS age related eye disease study; ARMD age related macular degeneration; POAG primary open angle glaucoma; EBMD epithelial/anterior basement  membrane dystrophy; ACIOL anterior chamber intraocular lens; IOL intraocular lens; PCIOL posterior chamber intraocular lens; Phaco/IOL phacoemulsification with intraocular lens placement; Russell photorefractive keratectomy; LASIK laser assisted in situ keratomileusis; HTN hypertension; DM diabetes mellitus; COPD chronic obstructive pulmonary disease

## 2021-02-05 NOTE — Assessment & Plan Note (Signed)
No signs of active CNVM OS today

## 2021-02-06 ENCOUNTER — Encounter (INDEPENDENT_AMBULATORY_CARE_PROVIDER_SITE_OTHER): Payer: PPO | Admitting: Ophthalmology

## 2021-02-06 DIAGNOSIS — I129 Hypertensive chronic kidney disease with stage 1 through stage 4 chronic kidney disease, or unspecified chronic kidney disease: Secondary | ICD-10-CM | POA: Diagnosis not present

## 2021-02-11 DIAGNOSIS — H34211 Partial retinal artery occlusion, right eye: Secondary | ICD-10-CM | POA: Diagnosis not present

## 2021-02-11 DIAGNOSIS — Z961 Presence of intraocular lens: Secondary | ICD-10-CM | POA: Diagnosis not present

## 2021-02-11 DIAGNOSIS — H401132 Primary open-angle glaucoma, bilateral, moderate stage: Secondary | ICD-10-CM | POA: Diagnosis not present

## 2021-02-11 DIAGNOSIS — H0102A Squamous blepharitis right eye, upper and lower eyelids: Secondary | ICD-10-CM | POA: Diagnosis not present

## 2021-02-11 DIAGNOSIS — H0102B Squamous blepharitis left eye, upper and lower eyelids: Secondary | ICD-10-CM | POA: Diagnosis not present

## 2021-02-11 DIAGNOSIS — H353211 Exudative age-related macular degeneration, right eye, with active choroidal neovascularization: Secondary | ICD-10-CM | POA: Diagnosis not present

## 2021-02-11 DIAGNOSIS — H353122 Nonexudative age-related macular degeneration, left eye, intermediate dry stage: Secondary | ICD-10-CM | POA: Diagnosis not present

## 2021-02-11 DIAGNOSIS — H532 Diplopia: Secondary | ICD-10-CM | POA: Diagnosis not present

## 2021-02-19 ENCOUNTER — Ambulatory Visit: Payer: PPO | Admitting: Pulmonary Disease

## 2021-02-19 ENCOUNTER — Other Ambulatory Visit: Payer: Self-pay

## 2021-02-19 ENCOUNTER — Encounter: Payer: Self-pay | Admitting: Pulmonary Disease

## 2021-02-19 VITALS — BP 120/68 | HR 62 | Temp 97.9°F | Ht 70.0 in | Wt 190.8 lb

## 2021-02-19 DIAGNOSIS — R059 Cough, unspecified: Secondary | ICD-10-CM

## 2021-02-19 DIAGNOSIS — J4540 Moderate persistent asthma, uncomplicated: Secondary | ICD-10-CM

## 2021-02-19 DIAGNOSIS — R0609 Other forms of dyspnea: Secondary | ICD-10-CM

## 2021-02-19 DIAGNOSIS — R0600 Dyspnea, unspecified: Secondary | ICD-10-CM | POA: Diagnosis not present

## 2021-02-19 NOTE — Patient Instructions (Signed)
Nice to see you  We will get a CT scan and labs to see what the reason for the mucous is and the best way to try to get rid of it.   Return to clinic in 3 months with Dr. Silas Flood or sooner as needed

## 2021-02-20 LAB — CBC WITH DIFFERENTIAL/PLATELET
Basophils Absolute: 0.1 10*3/uL (ref 0.0–0.1)
Basophils Relative: 0.5 % (ref 0.0–3.0)
Eosinophils Absolute: 0.1 10*3/uL (ref 0.0–0.7)
Eosinophils Relative: 0.9 % (ref 0.0–5.0)
HCT: 38.4 % — ABNORMAL LOW (ref 39.0–52.0)
Hemoglobin: 12.5 g/dL — ABNORMAL LOW (ref 13.0–17.0)
Lymphocytes Relative: 16 % (ref 12.0–46.0)
Lymphs Abs: 1.8 10*3/uL (ref 0.7–4.0)
MCHC: 32.7 g/dL (ref 30.0–36.0)
MCV: 87.2 fl (ref 78.0–100.0)
Monocytes Absolute: 0.9 10*3/uL (ref 0.1–1.0)
Monocytes Relative: 8.1 % (ref 3.0–12.0)
Neutro Abs: 8.2 10*3/uL — ABNORMAL HIGH (ref 1.4–7.7)
Neutrophils Relative %: 74.5 % (ref 43.0–77.0)
Platelets: 247 10*3/uL (ref 150.0–400.0)
RBC: 4.4 Mil/uL (ref 4.22–5.81)
RDW: 15.5 % (ref 11.5–15.5)
WBC: 11.1 10*3/uL — ABNORMAL HIGH (ref 4.0–10.5)

## 2021-02-20 LAB — IGE: IgE (Immunoglobulin E), Serum: 52 kU/L (ref <=114)

## 2021-02-23 LAB — ALLERGEN PROFILE, PERENNIAL ALLERGEN IGE

## 2021-02-25 DIAGNOSIS — F411 Generalized anxiety disorder: Secondary | ICD-10-CM | POA: Diagnosis not present

## 2021-02-25 DIAGNOSIS — R634 Abnormal weight loss: Secondary | ICD-10-CM | POA: Diagnosis not present

## 2021-02-25 DIAGNOSIS — J431 Panlobular emphysema: Secondary | ICD-10-CM | POA: Diagnosis not present

## 2021-02-26 ENCOUNTER — Ambulatory Visit (INDEPENDENT_AMBULATORY_CARE_PROVIDER_SITE_OTHER)
Admission: RE | Admit: 2021-02-26 | Discharge: 2021-02-26 | Disposition: A | Discharge status: Home / Self Care | Payer: PPO | Source: Ambulatory Visit | Attending: Pulmonary Disease | Admitting: Pulmonary Disease

## 2021-02-26 ENCOUNTER — Other Ambulatory Visit: Payer: Self-pay

## 2021-02-26 DIAGNOSIS — I700 Atherosclerosis of aorta: Secondary | ICD-10-CM | POA: Diagnosis not present

## 2021-02-26 DIAGNOSIS — R059 Cough, unspecified: Secondary | ICD-10-CM | POA: Diagnosis not present

## 2021-02-26 DIAGNOSIS — R0600 Dyspnea, unspecified: Secondary | ICD-10-CM | POA: Diagnosis not present

## 2021-02-26 DIAGNOSIS — R0609 Other forms of dyspnea: Secondary | ICD-10-CM

## 2021-02-27 NOTE — Progress Notes (Signed)
$'@Patient'f$  ID: Robert Doyle, male    DOB: 10/11/1933, 87 y.o.   MRN: [id number]  Chief Complaint  Patient presents with   Consult    COPD- coughing up yellow phlegm     Referring provider: Lajean Manes, MD  HPI:   87 year old man with past medical history of COPD whom we are seeing for evaluation of chronic cough.  PCP note reviewed.  Most recent pulmonary note 2016 reviewed.  Pertinent review of papillary notes and patient's history, has a history of COPD and asthma overlap.  Unfortunate, overall, symptoms not very well controlled.  He reports 3-4 exacerbations a year requiring prednisone.  Usually cough and worsening shortness of breath.  He has been adherent to Trelegy but despite this is quite symptomatic.  He has ongoing chronic cough which is his biggest complaint.  Is productive of yellow phlegm.  Present throughout the day.  Maybe a bit worse in the evenings.  Not much improved with the Trelegy inhaler. No seasonal environmental factors he can identify that make things better or worse.  No position to make things better or worse.  When the cough gets really bad and associated shortness of breath, prednisone does seem to help symptoms although returned back to poor baseline after many days.  Most recent chest x-ray 09/2018 reviewed which a monitor was noted and reveals clear lungs bilaterally.  Similar appearing chest x-ray 01/2018 on my interpretation and review.  PMH: Tobacco abuse in remission, hypertension, seasonal allergies Surgical history: Cataract surgery, deviated septum surgery, rotator cuff Family history: Father with prostate cancer, mother with macular degeneration Social history: Former smoker, 30 pack years, lives in [place] / Pulmonary Flowsheets:   ACT:  No flowsheet data found.  MMRC: No flowsheet data found.  Epworth:  No flowsheet data found.  Tests:   FENO:  No results found for: NITRICOXIDE  PFT: No flowsheet data  found.  WALK:  No flowsheet data found.  Imaging: Personally reviewed and as per EMR discussion this note   Lab Results: Personally reviewed, notably eosinophils not elevated significantly CBC    Component Value Date/Time   WBC 11.1 (H) 02/19/2021 1545   RBC 4.40 02/19/2021 1545   HGB 12.5 (L) 02/19/2021 1545   HCT 38.4 (L) 02/19/2021 1545   PLT 247.0 02/19/2021 1545   MCV 87.2 02/19/2021 1545   MCH 30.4 05/23/2013 1855   MCHC 32.7 02/19/2021 1545   RDW 15.5 02/19/2021 1545   LYMPHSABS 1.8 02/19/2021 1545   MONOABS 0.9 02/19/2021 1545   EOSABS 0.1 02/19/2021 1545   BASOSABS 0.1 02/19/2021 1545    BMET    Component Value Date/Time   NA 140 05/23/2013 1855   K 3.8 05/23/2013 1855   CL 104 05/23/2013 1855   CO2 27 05/23/2013 1855   GLUCOSE 102 (H) 05/23/2013 1855   BUN 19 05/23/2013 1855   CREATININE 1.34 05/23/2013 1855   CALCIUM 9.2 05/23/2013 1855   GFRNONAA 49 (L) 05/23/2013 1855   GFRAA 57 (L) 05/23/2013 1855    BNP No results found for: BNP  ProBNP    Component Value Date/Time   PROBNP 12.0 05/23/2013 1855    Specialty Problems       Pulmonary Problems   ALLERGIC RHINITIS    Qualifier: Diagnosis of  By: Annamaria Boots MD, Elicia Lamp D    Qualifier: Diagnosis of  By: Annamaria Boots MD, Kasandra Knudsen       Upper  respiratory infection    No Known Allergies  Immunization History  Administered Date(s) Administered   Influenza Split 05/14/2011, 04/21/2012, 04/12/2013   Influenza Whole 04/12/2009   Influenza, High Dose Seasonal PF 04/23/2015   Influenza, Quadrivalent, Recombinant, Inj, Pf 05/18/2017, 05/17/2018   Influenza-Unspecified 05/13/2018   Pneumococcal Conjugate-13 05/17/2018   Td 04/29/2016    Past Medical History:  Diagnosis Date   Allergic rhinitis    Asthma    COPD (chronic obstructive pulmonary disease) ([place])    Hypertension    Macular degeneration     Tobacco History: Social History   Tobacco Use  Smoking Status Former    Packs/day: 2.00   Years: 15.00   Pack years: 30.00   Types: Cigarettes   Quit date: 07/14/1963   Years since quitting: 57.6  Smokeless Tobacco Never   Counseling given: Not Answered   Continue to not smoke  Outpatient Encounter Medications as of 02/19/2021  Medication Sig   albuterol (PROAIR HFA) 108 (90 BASE) MCG/ACT inhaler Inhale 2 puffs into the lungs every 4 (four) hours as needed for wheezing or shortness of breath.   albuterol (PROVENTIL) (2.5 MG/3ML) 0.083% nebulizer solution Take 2.5 mg by nebulization every 6 (six) hours as needed for wheezing or shortness of breath.   albuterol (PROVENTIL) (2.5 MG/3ML) 0.083% nebulizer solution USE 1 VIAL IN NEBULIZER EVERY 6 HOURS AS NEEDED FOR WHEEZING/SHORTNESS OF BREATH   amLODipine (NORVASC) 10 MG tablet Take 10 mg by mouth daily.   ANORO ELLIPTA 62.5-25 MCG/INH AEPB 1 puff daily.   aspirin EC 81 MG tablet Take 81 mg by mouth daily.   azithromycin (ZITHROMAX) 250 MG tablet Take 2 tablets today then 1 tablet daily until gone   Calcium Carb-Cholecalciferol (CALCIUM 600 + D PO) Take 1 tablet by mouth daily.   fluticasone furoate-vilanterol (BREO ELLIPTA) 100-25 MCG/INH AEPB Inhale 1 puff into the lungs daily.   Fluticasone-Umeclidin-Vilant (TRELEGY ELLIPTA) 100-62.5-25 MCG/INH AEPB Inhale into the lungs.   hydrochlorothiazide (HYDRODIURIL) 12.5 MG tablet Take 12.5 mg by mouth every morning.   hydrochlorothiazide (HYDRODIURIL) 25 MG tablet Take 25 mg by mouth every morning.   ibuprofen (ADVIL) 800 MG tablet Take 800 mg by mouth 2 (two) times daily.   latanoprost (XALATAN) 0.005 % ophthalmic solution Place 1 drop into both eyes daily.   losartan (COZAAR) 50 MG tablet Take 50 mg by mouth daily.    lovastatin (MEVACOR) 40 MG tablet Take 40 mg by mouth every morning.    Multiple Vitamins-Minerals (MULTIVITAMIN PO) Take 1 tablet by mouth daily.   Multiple Vitamins-Minerals (PRESERVISION AREDS PO) Take 1 tablet by mouth 2 (two) times daily.    Omega-3 Fatty Acids (FISH OIL) 1200 MG CAPS Take 1,200 mg by mouth daily.   No facility-administered encounter medications on file as of 02/19/2021.     Review of Systems  Review of Systems  No chest pain with exertion.  No orthopnea or PND.  Comprehensive review of systems otherwise negative. Physical Exam  BP 120/68 (BP Location: Left Arm, Patient Position: Sitting, Cuff Size: Normal)   Pulse 62   Temp 97.9 F (36.6 C) (Oral)   Ht '5\' 10"'$  (1.778 m)   Wt 190 lb 12.8 oz (86.5 kg)   SpO2 99%   BMI 27.38 kg/m   Wt Readings from Last 5 Encounters:  02/19/21 190 lb 12.8 oz (86.5 kg)  03/12/14 198 lb (89.8 kg)  10/20/13 196 lb 6.4 oz (89.1 kg)  10/01/13 200 lb (90.7 kg)  09/22/13 213  lb (96.6 kg)    BMI Readings from Last 5 Encounters:  02/19/21 27.38 kg/m  03/12/14 28.01 kg/m  10/20/13 27.78 kg/m  10/01/13 28.70 kg/m  09/22/13 30.13 kg/m     Physical Exam General: Well-appearing, no acute distress Eyes: EOMI, no icterus Neck: Supple, no JVP Cardiovascular: Regular in rhythm, no murmur Pulmonary: Clear to auscultation, no wheeze, normal work of breathing Endo: Nondistended, bowel sounds present MSK: No synovitis, no joint effusion Neuro: Normal gait, no weakness Psych: Normal mood, full affect   Assessment & Plan:   Chronic Cough: Suspect combination of factors including history of cigarette smoke, query chronic bronchitis.  Given his sputum production, possible bronchiectasis.  Also, possible contribution of asthma, cough variant asthma, given his recurrent exacerbations and improvement of cough with prednisone.  Given productive cough, will obtain CT chest to evaluate possible bronchiectasis as this would alter therapy.  Please see asthma below.  Asthma: Diagnosed in the past.  Atopic symptoms.  Continues on Trelegy with frequent exacerbations.  Will phenotype of eosinophils, IgE, RAST panel today to see if he is a candidate for biologic therapy.  Evaluate other  causes of cough as above with CT chest.   Return in about 3 months (around 05/22/2021).   Lanier Clam, MD 02/27/2021

## 2021-03-10 ENCOUNTER — Telehealth: Payer: Self-pay | Admitting: Pulmonary Disease

## 2021-03-10 DIAGNOSIS — J479 Bronchiectasis, uncomplicated: Secondary | ICD-10-CM

## 2021-03-10 NOTE — Telephone Encounter (Signed)
Called and spoke with patient. He was asking for the results of his CT scan from 02/27/21. I advised him that I would send a message over to [facility] for him. He verbalized understanding.   MH, can you please advise on his CT scans? Thanks!

## 2021-03-11 NOTE — Telephone Encounter (Signed)
CT scan demonstrated clear lungs. Some of the air tubes were dilated, particularly in the lower parts of the lung. This is called bronchiectasis. This could be a cause of the cough. When the airways are dilated mucous can be hard to move out of the lungs and then builds up. The cough can he hard to treat. I recommend trying a flutter valve twice a day to help with the mucous. We can try nebulized salt water as well. This is to help hydrate the mucous and make it easier to mobilize.

## 2021-03-14 DIAGNOSIS — J209 Acute bronchitis, unspecified: Secondary | ICD-10-CM | POA: Diagnosis not present

## 2021-03-14 DIAGNOSIS — J9801 Acute bronchospasm: Secondary | ICD-10-CM | POA: Diagnosis not present

## 2021-03-18 ENCOUNTER — Telehealth: Payer: Self-pay | Admitting: *Deleted

## 2021-03-18 MED ORDER — ALBUTEROL SULFATE (2.5 MG/3ML) 0.083% IN NEBU: 2.5000 mg | INHALATION_SOLUTION | Freq: Four times a day (QID) | RESPIRATORY_TRACT | 3 refills | Status: AC | PRN

## 2021-03-18 NOTE — Telephone Encounter (Signed)
Pt. Arrived at the office to get flutter valve and to be shown how to use it.  Patient instructed and form for [employer] signed.  Patient verbalized understanding of use of flutter valve.  Requested refill of nebulizer solution.  His PCP, Dr. Felipa Eth is retiring.  Nebs refilled.

## 2021-03-18 NOTE — Telephone Encounter (Signed)
Called and spoke with patient. He verbalized understanding and wishes to use the flutter valve. I explained to him how to use the device but also asked that he comes by the office to pick it up, to ask for someone to show him how to use it. He verbalized understanding and stated that he would be here this afternoon.   Order has been placed.   Nothing further needed at time of call.

## 2021-03-18 NOTE — Progress Notes (Signed)
Pt instructed on use of flutter valve, he verbalized understanding.  Nothing further needed.

## 2021-03-19 DIAGNOSIS — I129 Hypertensive chronic kidney disease with stage 1 through stage 4 chronic kidney disease, or unspecified chronic kidney disease: Secondary | ICD-10-CM | POA: Diagnosis not present

## 2021-03-19 DIAGNOSIS — Z23 Encounter for immunization: Secondary | ICD-10-CM | POA: Diagnosis not present

## 2021-03-19 DIAGNOSIS — J449 Chronic obstructive pulmonary disease, unspecified: Secondary | ICD-10-CM | POA: Diagnosis not present

## 2021-03-19 DIAGNOSIS — N1831 Chronic kidney disease, stage 3a: Secondary | ICD-10-CM | POA: Diagnosis not present

## 2021-03-19 DIAGNOSIS — F411 Generalized anxiety disorder: Secondary | ICD-10-CM | POA: Diagnosis not present

## 2021-03-19 DIAGNOSIS — G250 Essential tremor: Secondary | ICD-10-CM | POA: Diagnosis not present

## 2021-04-02 ENCOUNTER — Ambulatory Visit (INDEPENDENT_AMBULATORY_CARE_PROVIDER_SITE_OTHER): Payer: PPO | Admitting: Ophthalmology

## 2021-04-02 ENCOUNTER — Other Ambulatory Visit: Payer: Self-pay

## 2021-04-02 ENCOUNTER — Encounter (INDEPENDENT_AMBULATORY_CARE_PROVIDER_SITE_OTHER): Payer: Self-pay | Admitting: Ophthalmology

## 2021-04-02 DIAGNOSIS — H353211 Exudative age-related macular degeneration, right eye, with active choroidal neovascularization: Secondary | ICD-10-CM

## 2021-04-02 MED ORDER — AFLIBERCEPT 2MG/0.05ML IZ SOLN FOR KALEIDOSCOPE
2.0000 mg | INTRAVITREAL | Status: AC | PRN
  Administered 2021-04-02: 2 mg via INTRAVITREAL

## 2021-04-02 NOTE — Progress Notes (Signed)
04/02/2021     CHIEF COMPLAINT Patient presents for  Chief Complaint  Patient presents with   Retina Follow Up    8 week fu OU and Eylea OD Pt states, "I am having more trouble seeing and I did not pass my test for my license at the [facility]. I have an appointment to see Dr. Katy Fitch to see if glasses will help."       HISTORY OF PRESENT ILLNESS: Robert Doyle is a 87 y.o. male who presents to the clinic today for:   HPI     Retina Follow Up   Patient presents with  Wet AMD.  In right eye.  This started 8 weeks ago.  Severity is mild.  Duration of 8 weeks.  Since onset it is gradually worsening. Additional comments: 8 week fu OU and Eylea OD Pt states, "I am having more trouble seeing and I did not pass my test for my license at the [facility]. I have an appointment to see Dr. Katy Fitch to see if glasses will help."         Comments   8 week fu ou oct eylea od. Pt states vision doesn't seem as good as it was, "seems more blurred." Denies any new FOL. Pt states "about 3 or 4 weeks ago I have noticed a new floater in my eye right but it has gone away since the last week."       Last edited by Laurin Coder on 04/02/2021 10:38 AM.      Referring physician: Lajean Manes, MD [address],  [place] [address]  HISTORICAL INFORMATION:   Selected notes from the MEDICAL RECORD NUMBER       CURRENT MEDICATIONS: Current Outpatient Medications (Ophthalmic Drugs)  Medication Sig   latanoprost (XALATAN) 0.005 % ophthalmic solution Place 1 drop into both eyes daily.   No current facility-administered medications for this visit. (Ophthalmic Drugs)   Current Outpatient Medications (Other)  Medication Sig   albuterol (PROVENTIL) (2.5 MG/3ML) 0.083% nebulizer solution Take 3 mLs (2.5 mg total) by nebulization every 6 (six) hours as needed for wheezing or shortness of breath.   amLODipine (NORVASC) 10 MG tablet Take 10 mg by mouth daily.   ANORO ELLIPTA 62.5-25 MCG/INH AEPB 1 puff  daily.   azithromycin (ZITHROMAX) 250 MG tablet Take 2 tablets today then 1 tablet daily until gone   Calcium Carb-Cholecalciferol (CALCIUM 600 + D PO) Take 1 tablet by mouth daily.   Fluticasone-Umeclidin-Vilant (TRELEGY ELLIPTA) 100-62.5-25 MCG/INH AEPB Inhale into the lungs.   hydrochlorothiazide (HYDRODIURIL) 12.5 MG tablet Take 12.5 mg by mouth every morning.   hydrochlorothiazide (HYDRODIURIL) 25 MG tablet Take 25 mg by mouth every morning.   ibuprofen (ADVIL) 800 MG tablet Take 800 mg by mouth 2 (two) times daily.   losartan (COZAAR) 50 MG tablet Take 50 mg by mouth daily.    lovastatin (MEVACOR) 40 MG tablet Take 40 mg by mouth every morning.    Multiple Vitamins-Minerals (MULTIVITAMIN PO) Take 1 tablet by mouth daily.   Multiple Vitamins-Minerals (PRESERVISION AREDS PO) Take 1 tablet by mouth 2 (two) times daily.   Omega-3 Fatty Acids (FISH OIL) 1200 MG CAPS Take 1,200 mg by mouth daily.   No current facility-administered medications for this visit. (Other)      REVIEW OF SYSTEMS:    ALLERGIES No Known Allergies  PAST MEDICAL HISTORY Past Medical History:  Diagnosis Date   Allergic rhinitis    Asthma  COPD (chronic obstructive pulmonary disease) (HCC)    Hypertension    Macular degeneration    Past Surgical History:  Procedure Laterality Date   CATARACT EXTRACTION W/PHACO Left 11/03/2016   Dr. Katy Fitch   CATARACT EXTRACTION W/PHACO Right 11/22/2016   Dr. Katy Fitch   COLONOSCOPY WITH PROPOFOL N/A 03/12/2014   Procedure: COLONOSCOPY WITH PROPOFOL;  Surgeon: Garlan Fair, MD;  Location: [facility];  Service: Endoscopy;  Laterality: N/A;   LUMBAR SPINE SURGERY     x 2 times   repair deviated septum  40-50 yrs ago   ROTATOR CUFF REPAIR Left     FAMILY HISTORY Family History  Problem Relation Age of Onset   Prostate cancer Father    Macular degeneration Mother     SOCIAL HISTORY Social History   Tobacco Use   Smoking status: Former    Packs/day: 2.00     Years: 15.00    Pack years: 30.00    Types: Cigarettes    Quit date: 07/14/1963    Years since quitting: 57.7   Smokeless tobacco: Never  Substance Use Topics   Alcohol use: No   Drug use: No         OPHTHALMIC EXAM:  Base Eye Exam     Visual Acuity (ETDRS)       Right Left   Dist [place] 20/40 -2 20/50 -2+2   Dist ph  20/30 -2 20/25 -1         Tonometry (Tonopen, 10:42 AM)       Right Left   Pressure 13 15         Pupils       Pupils Dark Light React APD   Right PERRL 5 4 Slow None   Left PERRL 5 4 Slow None         Extraocular Movement       Right Left    Full Full         Neuro/Psych     Oriented x3: Yes   Mood/Affect: Normal         Dilation     Both eyes: 1.0% Mydriacyl, 2.5% Phenylephrine @ 10:42 AM           Slit Lamp and Fundus Exam     External Exam       Right Left   External Normal Normal         Slit Lamp Exam       Right Left   Lids/Lashes Normal Normal   Conjunctiva/Sclera White and quiet White and quiet   Cornea Clear Clear   Anterior Chamber Deep and quiet Deep and quiet   Iris Round and reactive Round and reactive   Lens Posterior chamber intraocular lens Posterior chamber intraocular lens   Anterior Vitreous Normal Normal         Fundus Exam       Right Left   Posterior Vitreous Posterior vitreous detachment Posterior vitreous detachment   Disc Normal Normal   C/D Ratio 0.4 0.55   Macula Retinal pigment epithelial mottling, serous macular thickening, no hemorrhage, no exudates, Pigmented atrophy Intermediate age related macular degeneration, no hemorrhage, Retinal pigment epithelial mottling   Vessels Normal, no signs of Hollenhorst plaque seen, as it was noted recent examination on 04/02/2020 and that plaque was present between eye nevus superotemporally and the macula Normal   Periphery Normal, small flat nevus along the superotemporal arcade Normal            IMAGING  AND PROCEDURES  Imaging  and Procedures for 04/02/21  Intravitreal Injection, Pharmacologic Agent - OD - Right Eye       Time Out 04/02/2021. 11:27 AM. Confirmed correct patient, procedure, site, and patient consented.   Anesthesia Topical anesthesia was used. Anesthetic medications included [place] 3.5%.   Procedure Preparation included Tobramycin 0.3%, 10% betadine to eyelids, 5% betadine to ocular surface. A 30 gauge needle was used.   Injection: 2 mg aflibercept 2 MG/0.05ML   Route: Intravitreal, Site: Right Eye   NDC: [id number], Lot: [id number], Waste: 0 mL   Post-op Post injection exam found visual acuity of at least counting fingers. The patient tolerated the procedure well. There were no complications. The patient received written and verbal post procedure care education. Post injection medications included ocuflox.      OCT, Retina - OU - Both Eyes       Right Eye Quality was good. Scan locations included subfoveal. Central Foveal Thickness: 361. Progression has been stable. Findings include subretinal fluid, disciform scar, choroidal neovascular membrane.   Left Eye Quality was good. Scan locations included subfoveal. Central Foveal Thickness: 270. Progression has been stable. Findings include abnormal foveal contour, intraretinal hyper-reflective material, subretinal hyper-reflective material.   Notes OD, improved overall CNVM with less subretinal fluid yet persistent through the FAZ, at 8-week interval.  We will maintain interval next to 8 weeks and and will tolerate subretinal fluid if no impact on acuity and not worsening next  No signs of active CNVM OS  OD with persistent subretinal fluid stable at 8-week follow-up, will need to repeat injection today and evaluation in 6 to 7 weeks               ASSESSMENT/PLAN:  Exudative age-related macular degeneration of right eye with active choroidal neovascularization ([place]) Persistent serous fluid detachment associated with CNVM at  8-week follow-up.  We will repeat injection today and next evaluation in 6 weeks     ICD-10-CM   1. Exudative age-related macular degeneration of right eye with active choroidal neovascularization (HCC)  H35.3211 Intravitreal Injection, Pharmacologic Agent - OD - Right Eye    OCT, Retina - OU - Both Eyes    aflibercept (EYLEA) SOLN 2 mg      1.  OD with chronic serous fluid from the wet AMD in the macular region at 8-week interval post Eylea.  We will repeat injection today  2.  Follow-up if shorter interval next 6 weeks, dilate likely injection Eylea at that time  3.  Ophthalmic Meds Ordered this visit:  Meds ordered this encounter  Medications   aflibercept (EYLEA) SOLN 2 mg       Return in about 6 weeks (around 05/14/2021) for dilate, OD, EYLEA OCT.  There are no Patient Instructions on file for this visit.   Explained the diagnoses, plan, and follow up with the patient and they expressed understanding.  Patient expressed understanding of the importance of proper follow up care.   Clent Demark Enio Hornback M.D. Diseases & Surgery of the Retina and Vitreous Retina & Diabetic [place] 04/02/21     Abbreviations: M myopia (nearsighted); A astigmatism; H hyperopia (farsighted); P presbyopia; Mrx spectacle prescription;  CTL contact lenses; OD right eye; OS left eye; OU both eyes  XT exotropia; ET esotropia; PEK punctate epithelial keratitis; PEE punctate epithelial erosions; DES dry eye syndrome; MGD meibomian gland dysfunction; ATs artificial tears; PFAT's preservative free artificial tears; [place] nuclear sclerotic cataract; PSC posterior subcapsular cataract; ERM epi-retinal membrane;  PVD posterior vitreous detachment; RD retinal detachment; DM diabetes mellitus; DR diabetic retinopathy; NPDR non-proliferative diabetic retinopathy; PDR proliferative diabetic retinopathy; CSME clinically significant macular edema; DME diabetic macular edema; dbh dot blot hemorrhages; CWS cotton wool spot;  POAG primary open angle glaucoma; C/D cup-to-disc ratio; HVF humphrey visual field; GVF goldmann visual field; OCT optical coherence tomography; IOP intraocular pressure; BRVO Branch retinal vein occlusion; CRVO central retinal vein occlusion; CRAO central retinal artery occlusion; BRAO branch retinal artery occlusion; RT retinal tear; SB scleral buckle; PPV pars plana vitrectomy; VH Vitreous hemorrhage; PRP panretinal laser photocoagulation; IVK intravitreal kenalog; VMT vitreomacular traction; MH Macular hole;  NVD neovascularization of the disc; NVE neovascularization elsewhere; AREDS age related eye disease study; ARMD age related macular degeneration; POAG primary open angle glaucoma; EBMD epithelial/anterior basement membrane dystrophy; ACIOL anterior chamber intraocular lens; IOL intraocular lens; PCIOL posterior chamber intraocular lens; Phaco/IOL phacoemulsification with intraocular lens placement; [place] photorefractive keratectomy; LASIK laser assisted in situ keratomileusis; HTN hypertension; DM diabetes mellitus; COPD chronic obstructive pulmonary disease

## 2021-04-02 NOTE — Assessment & Plan Note (Signed)
Persistent serous fluid detachment associated with CNVM at 8-week follow-up.  We will repeat injection today and next evaluation in 6 weeks

## 2021-05-02 DIAGNOSIS — J449 Chronic obstructive pulmonary disease, unspecified: Secondary | ICD-10-CM | POA: Diagnosis not present

## 2021-05-02 DIAGNOSIS — J9801 Acute bronchospasm: Secondary | ICD-10-CM | POA: Diagnosis not present

## 2021-05-02 DIAGNOSIS — J209 Acute bronchitis, unspecified: Secondary | ICD-10-CM | POA: Diagnosis not present

## 2021-05-12 DIAGNOSIS — I10 Essential (primary) hypertension: Secondary | ICD-10-CM | POA: Diagnosis not present

## 2021-05-15 ENCOUNTER — Other Ambulatory Visit: Payer: Self-pay

## 2021-05-15 ENCOUNTER — Ambulatory Visit (INDEPENDENT_AMBULATORY_CARE_PROVIDER_SITE_OTHER): Payer: PPO | Admitting: Ophthalmology

## 2021-05-15 ENCOUNTER — Encounter (INDEPENDENT_AMBULATORY_CARE_PROVIDER_SITE_OTHER): Payer: Self-pay | Admitting: Ophthalmology

## 2021-05-15 DIAGNOSIS — H353211 Exudative age-related macular degeneration, right eye, with active choroidal neovascularization: Secondary | ICD-10-CM | POA: Diagnosis not present

## 2021-05-15 DIAGNOSIS — H35721 Serous detachment of retinal pigment epithelium, right eye: Secondary | ICD-10-CM

## 2021-05-15 MED ORDER — AFLIBERCEPT 2MG/0.05ML IZ SOLN FOR KALEIDOSCOPE
2.0000 mg | INTRAVITREAL | Status: AC | PRN
  Administered 2021-05-15: 2 mg via INTRAVITREAL

## 2021-05-15 NOTE — Progress Notes (Signed)
05/15/2021     CHIEF COMPLAINT Patient presents for  Chief Complaint  Patient presents with   Retina Follow Up    8 week fu OU and Eylea OD Pt states, "I am having more trouble seeing and I did not pass my test for my license at the [facility]. I have an appointment to see Dr. Katy Fitch to see if glasses will help."       HISTORY OF PRESENT ILLNESS: Robert Doyle is a 87 y.o. male who presents to the clinic today for:   HPI     Retina Follow Up   Patient presents with  Wet AMD.  In right eye.  This started 6 weeks ago.  Severity is mild.  Duration of 6 weeks.  Since onset it is gradually worsening. Additional comments: 8 week fu OU and Eylea OD Pt states, "I am having more trouble seeing and I did not pass my test for my license at the [facility]. I have an appointment to see Dr. Katy Fitch to see if glasses will help."         Comments   6 week fu OD oct eylea OD. Pt states "worse a little at distance." Denies new FOL or floaters.      Last edited by Laurin Coder on 05/15/2021  1:32 PM.      Referring physician: Lajean Manes, [address] [place],  [place] [address]  HISTORICAL INFORMATION:   Selected notes from the MEDICAL RECORD NUMBER       CURRENT MEDICATIONS: Current Outpatient Medications (Ophthalmic Drugs)  Medication Sig   latanoprost (XALATAN) 0.005 % ophthalmic solution Place 1 drop into both eyes daily.   No current facility-administered medications for this visit. (Ophthalmic Drugs)   Current Outpatient Medications (Other)  Medication Sig   albuterol (PROVENTIL) (2.5 MG/3ML) 0.083% nebulizer solution Take 3 mLs (2.5 mg total) by nebulization every 6 (six) hours as needed for wheezing or shortness of breath.   amLODipine (NORVASC) 10 MG tablet Take 10 mg by mouth daily.   ANORO ELLIPTA 62.5-25 MCG/INH AEPB 1 puff daily.   azithromycin (ZITHROMAX) 250 MG tablet Take 2 tablets today then 1 tablet daily until gone   Calcium Carb-Cholecalciferol  (CALCIUM 600 + D PO) Take 1 tablet by mouth daily.   Fluticasone-Umeclidin-Vilant (TRELEGY ELLIPTA) 100-62.5-25 MCG/INH AEPB Inhale into the lungs.   hydrochlorothiazide (HYDRODIURIL) 12.5 MG tablet Take 12.5 mg by mouth every morning.   hydrochlorothiazide (HYDRODIURIL) 25 MG tablet Take 25 mg by mouth every morning.   ibuprofen (ADVIL) 800 MG tablet Take 800 mg by mouth 2 (two) times daily.   losartan (COZAAR) 50 MG tablet Take 50 mg by mouth daily.    lovastatin (MEVACOR) 40 MG tablet Take 40 mg by mouth every morning.    Multiple Vitamins-Minerals (MULTIVITAMIN PO) Take 1 tablet by mouth daily.   Multiple Vitamins-Minerals (PRESERVISION AREDS PO) Take 1 tablet by mouth 2 (two) times daily.   Omega-3 Fatty Acids (FISH OIL) 1200 MG CAPS Take 1,200 mg by mouth daily.   No current facility-administered medications for this visit. (Other)      REVIEW OF SYSTEMS: ROS   Negative for: Constitutional, Gastrointestinal, Neurological, Skin, Genitourinary, Musculoskeletal, HENT, Endocrine, Cardiovascular, Eyes, Respiratory, Psychiatric, Allergic/Imm, Heme/Lymph Last edited by Hurman Horn, MD on 05/15/2021  2:11 PM.       ALLERGIES No Known Allergies  PAST MEDICAL HISTORY Past Medical History:  Diagnosis Date   Allergic rhinitis  Asthma    COPD (chronic obstructive pulmonary disease) ([place])    Hypertension    Macular degeneration    Past Surgical History:  Procedure Laterality Date   CATARACT EXTRACTION W/PHACO Left 11/03/2016   Dr. Katy Fitch   CATARACT EXTRACTION W/PHACO Right 11/22/2016   Dr. Katy Fitch   COLONOSCOPY WITH PROPOFOL N/A 03/12/2014   Procedure: COLONOSCOPY WITH PROPOFOL;  Surgeon: Garlan Fair, MD;  Location: [facility];  Service: Endoscopy;  Laterality: N/A;   LUMBAR SPINE SURGERY     x 2 times   repair deviated septum  40-50 yrs ago   ROTATOR CUFF REPAIR Left     FAMILY HISTORY Family History  Problem Relation Age of Onset   Prostate cancer Father     Macular degeneration Mother     SOCIAL HISTORY Social History   Tobacco Use   Smoking status: Former    Packs/day: 2.00    Years: 15.00    Pack years: 30.00    Types: Cigarettes    Quit date: 07/14/1963    Years since quitting: 57.8   Smokeless tobacco: Never  Substance Use Topics   Alcohol use: No   Drug use: No         OPHTHALMIC EXAM:  Base Eye Exam     Visual Acuity (ETDRS)       Right Left   Dist [place] 20/40 -2 20/40 -2   Dist ph [place] 20/30 -2 20/30 -1         Tonometry (Tonopen, 1:35 PM)       Right Left   Pressure 11 10         Pupils       Pupils Dark Light APD   Right PERRL 5 4 None   Left PERRL 5 4 None         Extraocular Movement       Right Left    Full Full         Neuro/Psych     Oriented x3: Yes   Mood/Affect: Normal         Dilation     Right eye: 1.0% Mydriacyl, 2.5% Phenylephrine @ 1:35 PM           Slit Lamp and Fundus Exam     External Exam       Right Left   External Normal Normal         Slit Lamp Exam       Right Left   Lids/Lashes Normal Normal   Conjunctiva/Sclera White and quiet White and quiet   Cornea Clear Clear   Anterior Chamber Deep and quiet Deep and quiet   Iris Round and reactive Round and reactive   Lens Posterior chamber intraocular lens Posterior chamber intraocular lens   Anterior Vitreous Normal Normal         Fundus Exam       Right Left   Posterior Vitreous Posterior vitreous detachment    Disc Normal    C/D Ratio 0.4    Macula Retinal pigment epithelial mottling, serous macular thickening, no hemorrhage, no exudates, Pigmented atrophy    Vessels Normal, no signs of Hollenhorst plaque seen, as it was noted recent examination on 04/02/2020 and that plaque was present between eye nevus superotemporally and the macula    Periphery Normal, small flat nevus along the superotemporal arcade             IMAGING AND PROCEDURES  Imaging and Procedures for  05/15/21  Intravitreal  Injection, Pharmacologic Agent - OD - Right Eye       Time Out 05/15/2021. 2:09 PM. Confirmed correct patient, procedure, site, and patient consented.   Anesthesia Topical anesthesia was used. Anesthetic medications included Lidocaine 4%.   Procedure Preparation included Tobramycin 0.3%, 10% betadine to eyelids, 5% betadine to ocular surface. A 30 gauge needle was used.   Injection: 2 mg aflibercept 2 MG/0.05ML   Route: Intravitreal, Site: Right Eye   NDC: [id number], Lot: [id number], Waste: 0 mL   Post-op Post injection exam found visual acuity of at least counting fingers. The patient tolerated the procedure well. There were no complications. The patient received written and verbal post procedure care education. Post injection medications included ocuflox.      OCT, Retina - OU - Both Eyes       Right Eye Quality was good. Scan locations included subfoveal. Central Foveal Thickness: 328. Progression has been stable. Findings include subretinal fluid, disciform scar, choroidal neovascular membrane.   Left Eye Quality was good. Scan locations included subfoveal. Central Foveal Thickness: 270. Progression has been stable. Findings include abnormal foveal contour, intraretinal hyper-reflective material, subretinal hyper-reflective material.   Notes OD, improved overall CNVM with less subretinal fluid yet persistent through the FAZ, at 6-week interval.  We will maintain interval next to 6 weeks and and will tolerate subretinal fluid if no impact on acuity and not worsening next, improved OD at shorter interrval  No signs of active CNVM OS                 ASSESSMENT/PLAN:  Exudative age-related macular degeneration of right eye with active choroidal neovascularization (HCC) Continued serous retinal detachment subfoveal OD which has improved however at shorter interval.  Prior 8-week interval with more fluid now at 6-week follow-up interval post  Eylea less fluid and improved acuity per patient's symptoms.  Repeat injection today follow-up again in 6 weeks  Serous detachment of retinal pigment epithelium of right eye Compounded wet AMD improved at shorter interval follow-up     ICD-10-CM   1. Exudative age-related macular degeneration of right eye with active choroidal neovascularization (HCC)  H35.3211 Intravitreal Injection, Pharmacologic Agent - OD - Right Eye    OCT, Retina - OU - Both Eyes    aflibercept (EYLEA) SOLN 2 mg    2. Serous detachment of retinal pigment epithelium of right eye  H35.721       1.  OD improved today at shorter interval follow-up of 6 weeks.  Much less subretinal fluid.  Improved acuity per patient  2.  Repeat intravitreal injection intravitreal Eylea OD today and examination next in 6 weeks  3.  Ophthalmic Meds Ordered this visit:  Meds ordered this encounter  Medications   aflibercept (EYLEA) SOLN 2 mg       Return in about 6 weeks (around 06/26/2021) for DILATE OU, EYLEA OCT, OD.  There are no Patient Instructions on file for this visit.   Explained the diagnoses, plan, and follow up with the patient and they expressed understanding.  Patient expressed understanding of the importance of proper follow up care.   Clent Demark Lexis Potenza M.D. Diseases & Surgery of the Retina and Vitreous Retina & Diabetic [place] 05/15/21     Abbreviations: M myopia (nearsighted); A astigmatism; H hyperopia (farsighted); P presbyopia; Mrx spectacle prescription;  CTL contact lenses; OD right eye; OS left eye; OU both eyes  XT exotropia; ET esotropia; PEK punctate epithelial keratitis; PEE punctate epithelial erosions; DES dry  eye syndrome; MGD meibomian gland dysfunction; ATs artificial tears; PFAT's preservative free artificial tears; [place] nuclear sclerotic cataract; PSC posterior subcapsular cataract; ERM epi-retinal membrane; PVD posterior vitreous detachment; RD retinal detachment; DM diabetes mellitus;  DR diabetic retinopathy; NPDR non-proliferative diabetic retinopathy; PDR proliferative diabetic retinopathy; CSME clinically significant macular edema; DME diabetic macular edema; dbh dot blot hemorrhages; CWS cotton wool spot; POAG primary open angle glaucoma; C/D cup-to-disc ratio; HVF humphrey visual field; GVF goldmann visual field; OCT optical coherence tomography; IOP intraocular pressure; BRVO Branch retinal vein occlusion; CRVO central retinal vein occlusion; CRAO central retinal artery occlusion; BRAO branch retinal artery occlusion; RT retinal tear; SB scleral buckle; PPV pars plana vitrectomy; VH Vitreous hemorrhage; PRP panretinal laser photocoagulation; IVK intravitreal kenalog; VMT vitreomacular traction; MH Macular hole;  NVD neovascularization of the disc; NVE neovascularization elsewhere; AREDS age related eye disease study; ARMD age related macular degeneration; POAG primary open angle glaucoma; EBMD epithelial/anterior basement membrane dystrophy; ACIOL anterior chamber intraocular lens; IOL intraocular lens; PCIOL posterior chamber intraocular lens; Phaco/IOL phacoemulsification with intraocular lens placement; [place] photorefractive keratectomy; LASIK laser assisted in situ keratomileusis; HTN hypertension; DM diabetes mellitus; COPD chronic obstructive pulmonary disease

## 2021-05-15 NOTE — Assessment & Plan Note (Signed)
Continued serous retinal detachment subfoveal OD which has improved however at shorter interval.  Prior 8-week interval with more fluid now at 6-week follow-up interval post Eylea less fluid and improved acuity per patient's symptoms.  Repeat injection today follow-up again in 6 weeks

## 2021-05-15 NOTE — Assessment & Plan Note (Signed)
Compounded wet AMD improved at shorter interval follow-up

## 2021-05-23 DIAGNOSIS — I129 Hypertensive chronic kidney disease with stage 1 through stage 4 chronic kidney disease, or unspecified chronic kidney disease: Secondary | ICD-10-CM | POA: Diagnosis not present

## 2021-05-23 DIAGNOSIS — J449 Chronic obstructive pulmonary disease, unspecified: Secondary | ICD-10-CM | POA: Diagnosis not present

## 2021-05-23 DIAGNOSIS — N1831 Chronic kidney disease, stage 3a: Secondary | ICD-10-CM | POA: Diagnosis not present

## 2021-05-23 DIAGNOSIS — F411 Generalized anxiety disorder: Secondary | ICD-10-CM | POA: Diagnosis not present

## 2021-06-11 DIAGNOSIS — I10 Essential (primary) hypertension: Secondary | ICD-10-CM | POA: Diagnosis not present

## 2021-06-26 ENCOUNTER — Ambulatory Visit (INDEPENDENT_AMBULATORY_CARE_PROVIDER_SITE_OTHER): Payer: PPO | Admitting: Ophthalmology

## 2021-06-26 ENCOUNTER — Other Ambulatory Visit: Payer: Self-pay

## 2021-06-26 ENCOUNTER — Encounter (INDEPENDENT_AMBULATORY_CARE_PROVIDER_SITE_OTHER): Payer: Self-pay | Admitting: Ophthalmology

## 2021-06-26 DIAGNOSIS — H35721 Serous detachment of retinal pigment epithelium, right eye: Secondary | ICD-10-CM

## 2021-06-26 DIAGNOSIS — H353211 Exudative age-related macular degeneration, right eye, with active choroidal neovascularization: Secondary | ICD-10-CM | POA: Diagnosis not present

## 2021-06-26 DIAGNOSIS — H353124 Nonexudative age-related macular degeneration, left eye, advanced atrophic with subfoveal involvement: Secondary | ICD-10-CM | POA: Diagnosis not present

## 2021-06-26 MED ORDER — AFLIBERCEPT 2MG/0.05ML IZ SOLN FOR KALEIDOSCOPE
2.0000 mg | INTRAVITREAL | Status: AC | PRN
  Administered 2021-06-26: 2 mg via INTRAVITREAL

## 2021-06-26 NOTE — Assessment & Plan Note (Signed)
Shallow improved component of wet AMD

## 2021-06-26 NOTE — Assessment & Plan Note (Signed)
Chronic active serous detachment associated with CNVM stable if slightly improved at 6 weeks on Eylea now.

## 2021-06-26 NOTE — Progress Notes (Signed)
06/26/2021     CHIEF COMPLAINT Patient presents for  Chief Complaint  Patient presents with   Retina Follow Up      HISTORY OF PRESENT ILLNESS: Robert Doyle is a 87 y.o. male who presents to the clinic today for:   HPI     Retina Follow Up           Diagnosis: Wet AMD   Laterality: right eye   Onset: 6 weeks ago   Severity: mild   Duration: 6 weeks   Course: stable         Comments   6 weeks fu ou and oct and Eylea OD  Pt states, "I cannot really say that anything has changed. I cannot read well but it has been this way for a while. " Pt states VA OU stable since last visit. Pt denies FOL, floaters, or ocular pain OU.   Pt reports that he does not use Latanoprost anymore.       Last edited by Kendra Opitz, COA on 06/26/2021  2:17 PM.      Referring physician: Lajean Manes, MD [address],  [place] [address]  HISTORICAL INFORMATION:   Selected notes from the MEDICAL RECORD NUMBER       CURRENT MEDICATIONS: Current Outpatient Medications (Ophthalmic Drugs)  Medication Sig   latanoprost (XALATAN) 0.005 % ophthalmic solution Place 1 drop into both eyes daily.   No current facility-administered medications for this visit. (Ophthalmic Drugs)   Current Outpatient Medications (Other)  Medication Sig   albuterol (PROVENTIL) (2.5 MG/3ML) 0.083% nebulizer solution Take 3 mLs (2.5 mg total) by nebulization every 6 (six) hours as needed for wheezing or shortness of breath.   amLODipine (NORVASC) 10 MG tablet Take 10 mg by mouth daily.   ANORO ELLIPTA 62.5-25 MCG/INH AEPB 1 puff daily.   azithromycin (ZITHROMAX) 250 MG tablet Take 2 tablets today then 1 tablet daily until gone   Calcium Carb-Cholecalciferol (CALCIUM 600 + D PO) Take 1 tablet by mouth daily.   Fluticasone-Umeclidin-Vilant (TRELEGY ELLIPTA) 100-62.5-25 MCG/INH AEPB Inhale into the lungs.   hydrochlorothiazide (HYDRODIURIL) 12.5 MG tablet Take 12.5 mg by mouth every morning.    hydrochlorothiazide (HYDRODIURIL) 25 MG tablet Take 25 mg by mouth every morning.   ibuprofen (ADVIL) 800 MG tablet Take 800 mg by mouth 2 (two) times daily.   losartan (COZAAR) 50 MG tablet Take 50 mg by mouth daily.    lovastatin (MEVACOR) 40 MG tablet Take 40 mg by mouth every morning.    Multiple Vitamins-Minerals (MULTIVITAMIN PO) Take 1 tablet by mouth daily.   Multiple Vitamins-Minerals (PRESERVISION AREDS PO) Take 1 tablet by mouth 2 (two) times daily.   Omega-3 Fatty Acids (FISH OIL) 1200 MG CAPS Take 1,200 mg by mouth daily.   No current facility-administered medications for this visit. (Other)      REVIEW OF SYSTEMS:    ALLERGIES No Known Allergies  PAST MEDICAL HISTORY Past Medical History:  Diagnosis Date   Allergic rhinitis    Asthma    COPD (chronic obstructive pulmonary disease) ([place])    Hypertension    Macular degeneration    Past Surgical History:  Procedure Laterality Date   CATARACT EXTRACTION W/PHACO Left 11/03/2016   Dr. Katy Fitch   CATARACT EXTRACTION W/PHACO Right 11/22/2016   Dr. Katy Fitch   COLONOSCOPY WITH PROPOFOL N/A 03/12/2014   Procedure: COLONOSCOPY WITH PROPOFOL;  Surgeon: Garlan Fair, MD;  Location: [facility];  Service: Endoscopy;  Laterality: N/A;   LUMBAR SPINE SURGERY     x 2 times   repair deviated septum  40-50 yrs ago   ROTATOR CUFF REPAIR Left     FAMILY HISTORY Family History  Problem Relation Age of Onset   Prostate cancer Father    Macular degeneration Mother     SOCIAL HISTORY Social History   Tobacco Use   Smoking status: Former    Packs/day: 2.00    Years: 15.00    Pack years: 30.00    Types: Cigarettes    Quit date: 07/14/1963    Years since quitting: 57.9   Smokeless tobacco: Never  Substance Use Topics   Alcohol use: No   Drug use: No         OPHTHALMIC EXAM:  Base Eye Exam     Visual Acuity (ETDRS)       Right Left   Dist [place] 20/25 -2 20/50 -1   Dist ph [place]  20/20 -1         Tonometry  (Tonopen, 2:21 PM)       Right Left   Pressure 16 17         Pachymetry     06/26/2021         Pupils       Pupils Shape React APD   Right PERRL Round Brisk None   Left PERRL Round Brisk None         Visual Fields (Counting fingers)       Left Right    Full Full         Extraocular Movement       Right Left    Full, Ortho Full, Ortho         Neuro/Psych     Oriented x3: Yes   Mood/Affect: Normal         Dilation     Both eyes: 1.0% Mydriacyl, 2.5% Phenylephrine @ 2:21 PM           Slit Lamp and Fundus Exam     External Exam       Right Left   External Normal Normal         Slit Lamp Exam       Right Left   Lids/Lashes Normal Normal   Conjunctiva/Sclera White and quiet White and quiet   Cornea Clear Clear   Anterior Chamber Deep and quiet Deep and quiet   Iris Round and reactive Round and reactive   Lens Posterior chamber intraocular lens Posterior chamber intraocular lens   Anterior Vitreous Normal Normal         Fundus Exam       Right Left   Posterior Vitreous Posterior vitreous detachment    Disc Normal    C/D Ratio 0.5 0.6   Macula Retinal pigment epithelial mottling, serous macular thickening, no hemorrhage, no exudates, Pigmented atrophy Age related macular degeneration, Early age related macular degeneration, Retinal pigment epithelial mottling, no hemorrhage, Hard drusen   Vessels Normal, no signs of Hollenhorst plaque seen, as it was noted recent examination on 04/02/2020 and that plaque was present between eye nevus superotemporally and the macula    Periphery Normal, small flat nevus along the superotemporal arcade             IMAGING AND PROCEDURES  Imaging and Procedures for 06/26/21  OCT, Retina - OU - Both Eyes       Right Eye Quality was good. Scan locations included subfoveal. Central Foveal  Thickness: 323. Progression has been stable. Findings include subretinal fluid, disciform scar, choroidal  neovascular membrane.   Left Eye Quality was good. Scan locations included subfoveal. Central Foveal Thickness: 270. Progression has been stable. Findings include abnormal foveal contour, intraretinal hyper-reflective material, subretinal hyper-reflective material.   Notes OD, improved overall CNVM with less subretinal fluid yet persistent through the FAZ, at 6-week interval.  We will maintain interval next to 6-7 weeks and and will tolerate subretinal fluid if no impact on acuity and not worsening next, improved OD at shorter interrval  No signs of active CNVM OS         Intravitreal Injection, Pharmacologic Agent - OD - Right Eye       Time Out 06/26/2021. 2:47 PM. Confirmed correct patient, procedure, site, and patient consented.   Anesthesia Topical anesthesia was used. Anesthetic medications included Lidocaine 4%.   Procedure Preparation included Tobramycin 0.3%, 10% betadine to eyelids, 5% betadine to ocular surface. A 30 gauge needle was used.   Injection: 2 mg aflibercept 2 MG/0.05ML   Route: Intravitreal, Site: Right Eye   NDC: [id number], Lot: [id number], Waste: 0 mL   Post-op Post injection exam found visual acuity of at least counting fingers. The patient tolerated the procedure well. There were no complications. The patient received written and verbal post procedure care education. Post injection medications included ocuflox.              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  Advanced nonexudative age-related macular degeneration of left eye with subfoveal involvement No sign of CNVM today  Serous detachment of retinal pigment epithelium of right eye [place] improved component of wet AMD     ICD-10-CM   1. Exudative age-related macular degeneration of right  eye with active choroidal neovascularization (HCC)  H35.3211 OCT, Retina - OU - Both Eyes    Intravitreal Injection, Pharmacologic Agent - OD - Right Eye    aflibercept (EYLEA) SOLN 2 mg    2. Advanced nonexudative age-related macular degeneration of left eye with subfoveal involvement  H35.3124     3. Serous detachment of retinal pigment epithelium of right eye  H35.721       1.  OS with intermediate ARMD, no sign of CNVM OS  2.  OD similarly with intermediate ARMD yet with chronic serous retinal detachment signifying wet AMD which is improved slightly today at 6-week interval while using Eylea.  3.  Repeat injection Eylea today follow-up next in 6 to 7 weeks  Ophthalmic Meds Ordered this visit:  Meds ordered this encounter  Medications   aflibercept (EYLEA) SOLN 2 mg       Return in about 7 weeks (around 08/14/2021) for dilate, OD, EYLEA OCT.  There are no Patient Instructions on file for this visit.   Explained the diagnoses, plan, and follow up with the patient and they expressed understanding.  Patient expressed understanding of the importance of proper follow up care.   Clent Demark Herb Beltre M.D. Diseases & Surgery of the Retina and Vitreous Retina & Diabetic [place] 06/26/21     Abbreviations: M myopia (nearsighted); A astigmatism; H hyperopia (farsighted); P presbyopia; Mrx spectacle prescription;  CTL contact lenses; OD right eye; OS left eye; OU both eyes  XT exotropia; ET esotropia; PEK punctate epithelial keratitis; PEE punctate epithelial erosions; DES dry eye syndrome; MGD meibomian  gland dysfunction; ATs artificial tears; PFAT's preservative free artificial tears; [place] nuclear sclerotic cataract; PSC posterior subcapsular cataract; ERM epi-retinal membrane; PVD posterior vitreous detachment; RD retinal detachment; DM diabetes mellitus; DR diabetic retinopathy; NPDR non-proliferative diabetic retinopathy; PDR proliferative diabetic retinopathy; CSME clinically  significant macular edema; DME diabetic macular edema; dbh dot blot hemorrhages; CWS cotton wool spot; POAG primary open angle glaucoma; C/D cup-to-disc ratio; HVF humphrey visual field; GVF goldmann visual field; OCT optical coherence tomography; IOP intraocular pressure; BRVO Branch retinal vein occlusion; CRVO central retinal vein occlusion; CRAO central retinal artery occlusion; BRAO branch retinal artery occlusion; RT retinal tear; SB scleral buckle; PPV pars plana vitrectomy; VH Vitreous hemorrhage; PRP panretinal laser photocoagulation; IVK intravitreal kenalog; VMT vitreomacular traction; MH Macular hole;  NVD neovascularization of the disc; NVE neovascularization elsewhere; AREDS age related eye disease study; ARMD age related macular degeneration; POAG primary open angle glaucoma; EBMD epithelial/anterior basement membrane dystrophy; ACIOL anterior chamber intraocular lens; IOL intraocular lens; PCIOL posterior chamber intraocular lens; Phaco/IOL phacoemulsification with intraocular lens placement; [place] photorefractive keratectomy; LASIK laser assisted in situ keratomileusis; HTN hypertension; DM diabetes mellitus; COPD chronic obstructive pulmonary disease

## 2021-06-26 NOTE — Assessment & Plan Note (Signed)
No sign of CNVM today 

## 2021-07-11 DIAGNOSIS — I10 Essential (primary) hypertension: Secondary | ICD-10-CM | POA: Diagnosis not present

## 2021-08-12 DIAGNOSIS — I10 Essential (primary) hypertension: Secondary | ICD-10-CM | POA: Diagnosis not present

## 2021-08-14 ENCOUNTER — Encounter (INDEPENDENT_AMBULATORY_CARE_PROVIDER_SITE_OTHER): Payer: Self-pay | Admitting: Ophthalmology

## 2021-08-14 ENCOUNTER — Ambulatory Visit (INDEPENDENT_AMBULATORY_CARE_PROVIDER_SITE_OTHER): Payer: PPO | Admitting: Ophthalmology

## 2021-08-14 ENCOUNTER — Other Ambulatory Visit: Payer: Self-pay

## 2021-08-14 DIAGNOSIS — H353211 Exudative age-related macular degeneration, right eye, with active choroidal neovascularization: Secondary | ICD-10-CM | POA: Diagnosis not present

## 2021-08-14 DIAGNOSIS — H353124 Nonexudative age-related macular degeneration, left eye, advanced atrophic with subfoveal involvement: Secondary | ICD-10-CM | POA: Diagnosis not present

## 2021-08-14 MED ORDER — AFLIBERCEPT 2MG/0.05ML IZ SOLN FOR KALEIDOSCOPE
2.0000 mg | INTRAVITREAL | Status: AC | PRN
  Administered 2021-08-14: 2 mg via INTRAVITREAL

## 2021-08-14 NOTE — Progress Notes (Signed)
08/14/2021     CHIEF COMPLAINT Patient presents for  Chief Complaint  Patient presents with   Retina Follow Up      HISTORY OF PRESENT ILLNESS: Robert Doyle is a 88 y.o. male who presents to the clinic today for:   HPI     Retina Follow Up           Diagnosis: Wet AMD   Laterality: right eye   Onset: 7 weeks ago   Severity: mild   Duration: 7 weeks   Course: gradually worsening         Comments   7 weeks fu dilate Eylea OD, OCT. Pt states he notices a change in vision, "a small amount of change not a whole lot, but I am getting older." Denies new FOL/ floaters.      Last edited by Laurin Coder on 08/14/2021  2:25 PM.      Referring physician: Lajean Manes, MD [address],  [place] [address]  HISTORICAL INFORMATION:   Selected notes from the MEDICAL RECORD NUMBER       CURRENT MEDICATIONS: Current Outpatient Medications (Ophthalmic Drugs)  Medication Sig   latanoprost (XALATAN) 0.005 % ophthalmic solution Place 1 drop into both eyes daily.   No current facility-administered medications for this visit. (Ophthalmic Drugs)   Current Outpatient Medications (Other)  Medication Sig   albuterol (PROVENTIL) (2.5 MG/3ML) 0.083% nebulizer solution Take 3 mLs (2.5 mg total) by nebulization every 6 (six) hours as needed for wheezing or shortness of breath.   amLODipine (NORVASC) 10 MG tablet Take 10 mg by mouth daily.   ANORO ELLIPTA 62.5-25 MCG/INH AEPB 1 puff daily.   azithromycin (ZITHROMAX) 250 MG tablet Take 2 tablets today then 1 tablet daily until gone   Calcium Carb-Cholecalciferol (CALCIUM 600 + D PO) Take 1 tablet by mouth daily.   Fluticasone-Umeclidin-Vilant (TRELEGY ELLIPTA) 100-62.5-25 MCG/INH AEPB Inhale into the lungs.   hydrochlorothiazide (HYDRODIURIL) 12.5 MG tablet Take 12.5 mg by mouth every morning.   hydrochlorothiazide (HYDRODIURIL) 25 MG tablet Take 25 mg by mouth every morning.   ibuprofen (ADVIL) 800 MG tablet Take 800  mg by mouth 2 (two) times daily.   losartan (COZAAR) 50 MG tablet Take 50 mg by mouth daily.    lovastatin (MEVACOR) 40 MG tablet Take 40 mg by mouth every morning.    Multiple Vitamins-Minerals (MULTIVITAMIN PO) Take 1 tablet by mouth daily.   Multiple Vitamins-Minerals (PRESERVISION AREDS PO) Take 1 tablet by mouth 2 (two) times daily.   Omega-3 Fatty Acids (FISH OIL) 1200 MG CAPS Take 1,200 mg by mouth daily.   No current facility-administered medications for this visit. (Other)      REVIEW OF SYSTEMS:    ALLERGIES No Known Allergies  PAST MEDICAL HISTORY Past Medical History:  Diagnosis Date   Allergic rhinitis    Asthma    COPD (chronic obstructive pulmonary disease) ([place])    Hypertension    Macular degeneration    Past Surgical History:  Procedure Laterality Date   CATARACT EXTRACTION W/PHACO Left 11/03/2016   Dr. Katy Fitch   CATARACT EXTRACTION W/PHACO Right 11/22/2016   Dr. Katy Fitch   COLONOSCOPY WITH PROPOFOL N/A 03/12/2014   Procedure: COLONOSCOPY WITH PROPOFOL;  Surgeon: Garlan Fair, MD;  Location: [facility];  Service: Endoscopy;  Laterality: N/A;   LUMBAR SPINE SURGERY     x 2 times   repair deviated septum  40-50 yrs ago   ROTATOR CUFF REPAIR Left  FAMILY HISTORY Family History  Problem Relation Age of Onset   Prostate cancer Father    Macular degeneration Mother     SOCIAL HISTORY Social History   Tobacco Use   Smoking status: Former    Packs/day: 2.00    Years: 15.00    Pack years: 30.00    Types: Cigarettes    Quit date: 07/14/1963    Years since quitting: 58.1   Smokeless tobacco: Never  Substance Use Topics   Alcohol use: No   Drug use: No         OPHTHALMIC EXAM:  Base Eye Exam     Visual Acuity (ETDRS)       Right Left   Dist [place] 20/40 -2 20/40 -2   Dist ph [place] 20/30 -2 20/30 -1         Tonometry (Tonopen, 2:28 PM)       Right Left   Pressure 12 14         Pachymetry     06/26/2021         Pupils        Pupils React APD   Right PERRL Brisk None   Left PERRL Brisk None         Extraocular Movement       Right Left    Full Full         Neuro/Psych     Oriented x3: Yes   Mood/Affect: Normal         Dilation     Right eye: 1.0% Mydriacyl, 2.5% Phenylephrine @ 2:28 PM           Slit Lamp and Fundus Exam     External Exam       Right Left   External Normal Normal         Slit Lamp Exam       Right Left   Lids/Lashes Normal Normal   Conjunctiva/Sclera White and quiet White and quiet   Cornea Clear Clear   Anterior Chamber Deep and quiet Deep and quiet   Iris Round and reactive Round and reactive   Lens Posterior chamber intraocular lens Posterior chamber intraocular lens   Anterior Vitreous Normal Normal         Fundus Exam       Right Left   Posterior Vitreous Posterior vitreous detachment    Disc Normal    C/D Ratio 0.5    Macula Retinal pigment epithelial mottling, serous macular thickening, no hemorrhage, no exudates, Pigmented atrophy    Vessels Normal, no signs of Hollenhorst plaque seen, as it was noted recent examination on 04/02/2020 and that plaque was present between eye nevus superotemporally and the macula    Periphery Normal, small flat nevus along the superotemporal arcade             IMAGING AND PROCEDURES  Imaging and Procedures for 08/14/21  Intravitreal Injection, Pharmacologic Agent - OD - Right Eye       Time Out 08/14/2021. 3:34 PM. Confirmed correct patient, procedure, site, and patient consented.   Anesthesia Topical anesthesia was used. Anesthetic medications included Lidocaine 4%.   Procedure Preparation included Tobramycin 0.3%, 10% betadine to eyelids, 5% betadine to ocular surface. A 30 gauge needle was used.   Injection: 2 mg aflibercept 2 MG/0.05ML   Route: Intravitreal, Site: Right Eye   NDC: [id number], Lot: [id number], Waste: 0 mL   Post-op Post injection exam found visual acuity of at least  counting fingers. The  patient tolerated the procedure well. There were no complications. The patient received written and verbal post procedure care education. Post injection medications included ocuflox.      OCT, Retina - OU - Both Eyes       Right Eye Quality was good. Scan locations included subfoveal. Central Foveal Thickness: 318. Progression has been stable. Findings include subretinal fluid, disciform scar, choroidal neovascular membrane, abnormal foveal contour.   Left Eye Quality was good. Scan locations included subfoveal. Central Foveal Thickness: 270. Progression has been stable. Findings include abnormal foveal contour, intraretinal hyper-reflective material, subretinal hyper-reflective material.   Notes OD, improved overall CNVM with less subretinal fluid yet persistent through the FAZ, at 7-week interval.  We will maintain interval next to 6-7 weeks and and will tolerate subretinal fluid if no impact on acuity and not worsening next, improved OD at shorter interrval  No signs of active CNVM OS                 ASSESSMENT/PLAN:  Exudative age-related macular degeneration of right eye with active choroidal neovascularization (HCC) Chronically active yet stable and controlled CNVM with small amount of subretinal fluid at 6 to 7-week interval.  We will maintain on Eylea today Same interval in the future.  Advanced nonexudative age-related macular degeneration of left eye with subfoveal involvement OS but with no CNVM     ICD-10-CM   1. Exudative age-related macular degeneration of right eye with active choroidal neovascularization (HCC)  H35.3211 Intravitreal Injection, Pharmacologic Agent - OD - Right Eye    OCT, Retina - OU - Both Eyes    aflibercept (EYLEA) SOLN 2 mg    2. Advanced nonexudative age-related macular degeneration of left eye with subfoveal involvement  H35.3124       1.  OD, chronically active subfoveal SR fluid, yet still with preserved  acuity and maintained less CNVM activity on Eylea.  We will repeat injection today and maintain 7-week follow-up interval on Eylea  2.  3.  Ophthalmic Meds Ordered this visit:  Meds ordered this encounter  Medications   aflibercept (EYLEA) SOLN 2 mg       Return in about 7 weeks (around 10/02/2021) for dilate, OD, EYLEA OCT.  There are no Patient Instructions on file for this visit.   Explained the diagnoses, plan, and follow up with the patient and they expressed understanding.  Patient expressed understanding of the importance of proper follow up care.   Clent Demark Edward Guthmiller M.D. Diseases & Surgery of the Retina and Vitreous Retina & Diabetic [place] 08/14/21     Abbreviations: M myopia (nearsighted); A astigmatism; H hyperopia (farsighted); P presbyopia; Mrx spectacle prescription;  CTL contact lenses; OD right eye; OS left eye; OU both eyes  XT exotropia; ET esotropia; PEK punctate epithelial keratitis; PEE punctate epithelial erosions; DES dry eye syndrome; MGD meibomian gland dysfunction; ATs artificial tears; PFAT's preservative free artificial tears; [place] nuclear sclerotic cataract; PSC posterior subcapsular cataract; ERM epi-retinal membrane; PVD posterior vitreous detachment; RD retinal detachment; DM diabetes mellitus; DR diabetic retinopathy; NPDR non-proliferative diabetic retinopathy; PDR proliferative diabetic retinopathy; CSME clinically significant macular edema; DME diabetic macular edema; dbh dot blot hemorrhages; CWS cotton wool spot; POAG primary open angle glaucoma; C/D cup-to-disc ratio; HVF humphrey visual field; GVF goldmann visual field; OCT optical coherence tomography; IOP intraocular pressure; BRVO Branch retinal vein occlusion; CRVO central retinal vein occlusion; CRAO central retinal artery occlusion; BRAO branch retinal artery occlusion; RT retinal tear; SB scleral buckle; PPV pars  plana vitrectomy; VH Vitreous hemorrhage; PRP panretinal laser  photocoagulation; IVK intravitreal kenalog; VMT vitreomacular traction; MH Macular hole;  NVD neovascularization of the disc; NVE neovascularization elsewhere; AREDS age related eye disease study; ARMD age related macular degeneration; POAG primary open angle glaucoma; EBMD epithelial/anterior basement membrane dystrophy; ACIOL anterior chamber intraocular lens; IOL intraocular lens; PCIOL posterior chamber intraocular lens; Phaco/IOL phacoemulsification with intraocular lens placement; [place] photorefractive keratectomy; LASIK laser assisted in situ keratomileusis; HTN hypertension; DM diabetes mellitus; COPD chronic obstructive pulmonary disease

## 2021-08-14 NOTE — Assessment & Plan Note (Signed)
OS but with no CNVM

## 2021-08-14 NOTE — Assessment & Plan Note (Signed)
Chronically active yet stable and controlled CNVM with small amount of subretinal fluid at 6 to 7-week interval.  We will maintain on Eylea today Same interval in the future.

## 2021-08-26 DIAGNOSIS — G250 Essential tremor: Secondary | ICD-10-CM | POA: Diagnosis not present

## 2021-08-26 DIAGNOSIS — N1831 Chronic kidney disease, stage 3a: Secondary | ICD-10-CM | POA: Diagnosis not present

## 2021-08-26 DIAGNOSIS — J411 Mucopurulent chronic bronchitis: Secondary | ICD-10-CM | POA: Diagnosis not present

## 2021-08-26 DIAGNOSIS — G629 Polyneuropathy, unspecified: Secondary | ICD-10-CM | POA: Diagnosis not present

## 2021-08-26 DIAGNOSIS — Z79899 Other long term (current) drug therapy: Secondary | ICD-10-CM | POA: Diagnosis not present

## 2021-08-26 DIAGNOSIS — Z0000 Encounter for general adult medical examination without abnormal findings: Secondary | ICD-10-CM | POA: Diagnosis not present

## 2021-08-26 DIAGNOSIS — F411 Generalized anxiety disorder: Secondary | ICD-10-CM | POA: Diagnosis not present

## 2021-08-26 DIAGNOSIS — Z1389 Encounter for screening for other disorder: Secondary | ICD-10-CM | POA: Diagnosis not present

## 2021-08-26 DIAGNOSIS — H353211 Exudative age-related macular degeneration, right eye, with active choroidal neovascularization: Secondary | ICD-10-CM | POA: Diagnosis not present

## 2021-08-26 DIAGNOSIS — Z23 Encounter for immunization: Secondary | ICD-10-CM | POA: Diagnosis not present

## 2021-08-26 DIAGNOSIS — I129 Hypertensive chronic kidney disease with stage 1 through stage 4 chronic kidney disease, or unspecified chronic kidney disease: Secondary | ICD-10-CM | POA: Diagnosis not present

## 2021-08-26 DIAGNOSIS — J449 Chronic obstructive pulmonary disease, unspecified: Secondary | ICD-10-CM | POA: Diagnosis not present

## 2021-08-26 DIAGNOSIS — I700 Atherosclerosis of aorta: Secondary | ICD-10-CM | POA: Diagnosis not present

## 2021-08-26 DIAGNOSIS — E782 Mixed hyperlipidemia: Secondary | ICD-10-CM | POA: Diagnosis not present

## 2021-09-09 DIAGNOSIS — I129 Hypertensive chronic kidney disease with stage 1 through stage 4 chronic kidney disease, or unspecified chronic kidney disease: Secondary | ICD-10-CM | POA: Diagnosis not present

## 2021-09-30 ENCOUNTER — Other Ambulatory Visit: Payer: Self-pay

## 2021-09-30 ENCOUNTER — Encounter (INDEPENDENT_AMBULATORY_CARE_PROVIDER_SITE_OTHER): Payer: Self-pay | Admitting: Ophthalmology

## 2021-09-30 ENCOUNTER — Ambulatory Visit (INDEPENDENT_AMBULATORY_CARE_PROVIDER_SITE_OTHER): Payer: PPO | Admitting: Ophthalmology

## 2021-09-30 DIAGNOSIS — H353211 Exudative age-related macular degeneration, right eye, with active choroidal neovascularization: Secondary | ICD-10-CM

## 2021-09-30 MED ORDER — BEVACIZUMAB 2.5 MG/0.1ML IZ SOSY
2.5000 mg | PREFILLED_SYRINGE | INTRAVITREAL | Status: AC | PRN
  Administered 2021-09-30: 2.5 mg via INTRAVITREAL

## 2021-09-30 NOTE — Progress Notes (Signed)
? ? ?09/30/2021 ? ?  ? ?CHIEF COMPLAINT ?Patient presents for  ?Chief Complaint  ?Patient presents with  ? Macular Degeneration  ? ? ? ? ?HISTORY OF PRESENT ILLNESS: ?Robert Doyle is a 88 y.o. male who presents to the clinic today for:  ? ?HPI   ?7 weeks for dilate, OD EYLEA OCT. ?Pt denies FOL but still sees floaters in the right eye sometimes.  ?Pt states no changes in vision and medical history. ? ? ? ? ?Last edited by Silvestre Moment on 09/30/2021  1:01 PM.  ?  ? ? ?Referring physician: ?Lajean Manes, MD ?[address] ?[place],  [place] [address] ? ?HISTORICAL INFORMATION:  ? ?Selected notes from the [facility] ?  ?   ? ?CURRENT MEDICATIONS: ?Current Outpatient Medications (Ophthalmic Drugs)  ?Medication Sig  ? latanoprost (XALATAN) 0.005 % ophthalmic solution Place 1 drop into both eyes daily.  ? ?No current facility-administered medications for this visit. (Ophthalmic Drugs)  ? ?Current Outpatient Medications (Other)  ?Medication Sig  ? albuterol (PROVENTIL) (2.5 MG/3ML) 0.083% nebulizer solution Take 3 mLs (2.5 mg total) by nebulization every 6 (six) hours as needed for wheezing or shortness of breath.  ? amLODipine (NORVASC) 10 MG tablet Take 10 mg by mouth daily.  ? ANORO ELLIPTA 62.5-25 MCG/INH AEPB 1 puff daily.  ? azithromycin (ZITHROMAX) 250 MG tablet Take 2 tablets today then 1 tablet daily until gone  ? Calcium Carb-Cholecalciferol (CALCIUM 600 + D PO) Take 1 tablet by mouth daily.  ? Fluticasone-Umeclidin-Vilant (TRELEGY ELLIPTA) 100-62.5-25 MCG/INH AEPB Inhale into the lungs.  ? hydrochlorothiazide (HYDRODIURIL) 12.5 MG tablet Take 12.5 mg by mouth every morning.  ? hydrochlorothiazide (HYDRODIURIL) 25 MG tablet Take 25 mg by mouth every morning.  ? ibuprofen (ADVIL) 800 MG tablet Take 800 mg by mouth 2 (two) times daily.  ? losartan (COZAAR) 50 MG tablet Take 50 mg by mouth daily.   ? lovastatin (MEVACOR) 40 MG tablet Take 40 mg by mouth every morning.   ? Multiple Vitamins-Minerals (MULTIVITAMIN  PO) Take 1 tablet by mouth daily.  ? Multiple Vitamins-Minerals (PRESERVISION AREDS PO) Take 1 tablet by mouth 2 (two) times daily.  ? Omega-3 Fatty Acids (FISH OIL) 1200 MG CAPS Take 1,200 mg by mouth daily.  ? ?No current facility-administered medications for this visit. (Other)  ? ? ? ? ?REVIEW OF SYSTEMS: ?ROS   ?Negative for: Constitutional, Gastrointestinal, Neurological, Skin, Genitourinary, Musculoskeletal, HENT, Endocrine, Cardiovascular, Eyes, Respiratory, Psychiatric, Allergic/Imm, Heme/Lymph ?Last edited by Silvestre Moment on 09/30/2021  1:01 PM.  ?  ? ? ? ?ALLERGIES ?No Known Allergies ? ?PAST MEDICAL HISTORY ?Past Medical History:  ?Diagnosis Date  ? Allergic rhinitis   ? Asthma   ? COPD (chronic obstructive pulmonary disease) ([place])   ? Hypertension   ? Macular degeneration   ? ?Past Surgical History:  ?Procedure Laterality Date  ? CATARACT EXTRACTION W/PHACO Left 11/03/2016  ? Dr. Katy Fitch  ? CATARACT EXTRACTION W/PHACO Right 11/22/2016  ? Dr. Katy Fitch  ? COLONOSCOPY WITH PROPOFOL N/A 03/12/2014  ? Procedure: COLONOSCOPY WITH PROPOFOL;  Surgeon: Garlan Fair, MD;  Location: [facility];  Service: Endoscopy;  Laterality: N/A;  ? LUMBAR SPINE SURGERY    ? x 2 times  ? repair deviated septum  40-50 yrs ago  ? ROTATOR CUFF REPAIR Left   ? ? ?FAMILY HISTORY ?Family History  ?Problem Relation Age of Onset  ? Prostate cancer Father   ? Macular degeneration Mother   ? ? ?  SOCIAL HISTORY ?Social History  ? ?Tobacco Use  ? Smoking status: Former  ?  Packs/day: 2.00  ?  Years: 15.00  ?  Pack years: 30.00  ?  Types: Cigarettes  ?  Quit date: 07/14/1963  ?  Years since quitting: 58.2  ? Smokeless tobacco: Never  ?Substance Use Topics  ? Alcohol use: No  ? Drug use: No  ? ?  ? ?  ? ?OPHTHALMIC EXAM: ? ?Base Eye Exam   ? ? Visual Acuity (ETDRS)   ? ?   Right Left  ? Dist [place] 20/25 20/20 -2  ? Dist ph  20/20 -1   ? ?  ?  ? ? Tonometry (Tonopen, 1:08 PM)   ? ?   Right Left  ? Pressure 10 13  ? ?  ?  ? ? Pachymetry   ? ? 06/26/2021   ? ?  ?  ? ? Pupils   ? ?   Pupils APD  ? Right PERRL None  ? Left PERRL None  ? ?  ?  ? ? Visual Fields   ? ?   Left Right  ?  Full Full  ? ?  ?  ? ? Extraocular Movement   ? ?   Right Left  ?  Full Full  ? ?  ?  ? ? Neuro/Psych   ? ? Oriented x3: Yes  ? Mood/Affect: Normal  ? ?  ?  ? ? Dilation   ? ? Right eye: 1.0% Mydriacyl, 2.5% Phenylephrine @ 1:08 PM  ? ?  ?  ? ?  ? ?Slit Lamp and Fundus Exam   ? ? External Exam   ? ?   Right Left  ? External Normal Normal  ? ?  ?  ? ? Slit Lamp Exam   ? ?   Right Left  ? Lids/Lashes Normal Normal  ? Conjunctiva/Sclera White and quiet White and quiet  ? Cornea Clear Clear  ? Anterior Chamber Deep and quiet Deep and quiet  ? Iris Round and reactive Round and reactive  ? Lens Posterior chamber intraocular lens Posterior chamber intraocular lens  ? Anterior Vitreous Normal Normal  ? ?  ?  ? ? Fundus Exam   ? ?   Right Left  ? Posterior Vitreous Posterior vitreous detachment   ? Disc Normal   ? C/D Ratio 0.5   ? Macula Retinal pigment epithelial mottling, serous macular thickening, no hemorrhage, no exudates, Pigmented atrophy   ? Vessels Normal, no signs of Hollenhorst plaque seen, as it was noted recent examination on 04/02/2020 and that plaque was present between eye nevus superotemporally and the macula   ? Periphery Normal, small flat nevus along the superotemporal arcade   ? ?  ?  ? ?  ? ? ?IMAGING AND PROCEDURES  ?Imaging and Procedures for 09/30/21 ? ?OCT, Retina - OU - Both Eyes   ? ?   ?Right Eye ?Quality was good. Scan locations included subfoveal. Central Foveal Thickness: 316. Progression has been stable. Findings include subretinal fluid, disciform scar, choroidal neovascular membrane, abnormal foveal contour.  ? ?Left Eye ?Quality was good. Scan locations included subfoveal. Central Foveal Thickness: 277. Progression has been stable. Findings include abnormal foveal contour, intraretinal hyper-reflective material, subretinal hyper-reflective material.  ? ?Notes ?OD,  improved overall CNVM with less subretinal fluid yet persistent through the FAZ, at 7-week interval.  We will maintain interval next to 6-7 weeks and and will tolerate subretinal fluid if no  impact on acuity and not worsening next, improved OD at shorter interrval, stable overall ? ?No signs of active CNVM OS ? ? ? ? ? ?  ? ?Intravitreal Injection, Pharmacologic Agent - OD - Right Eye   ? ?   ?Time Out ?09/30/2021. 1:25 PM. Confirmed correct patient, procedure, site, and patient consented.  ? ?Anesthesia ?Topical anesthesia was used. Anesthetic medications included Lidocaine 4%.  ? ?Procedure ?Preparation included Tobramycin 0.3%, 10% betadine to eyelids, 5% betadine to ocular surface. A 30 gauge needle was used.  ? ?Injection: ?2.5 mg bevacizumab 2.5 MG/0.1ML ?  Route: Intravitreal, Site: Right Eye ?  [place]: [id number], Lot: [id number]  ? ?Post-op ?Post injection exam found visual acuity of at least counting fingers. The patient tolerated the procedure well. There were no complications. The patient received written and verbal post procedure care education. Post injection medications included ocuflox.  ? ?  ? ? ?  ?  ? ?  ?ASSESSMENT/PLAN: ? ?Exudative age-related macular degeneration of right eye with active choroidal neovascularization ([place]) ?OD at 7 weeks, chronic serous subfoveal fluid stabilized on Eylea with preserved acuity.  We will repeat injection today to maintain  ? ?  ICD-10-CM   ?1. Exudative age-related macular degeneration of right eye with active choroidal neovascularization ([facility])  H35.3211 OCT, Retina - OU - Both Eyes  ?  Intravitreal Injection, Pharmacologic Agent - OD - Right Eye  ?  bevacizumab (AVASTIN) SOSY 2.5 mg  ?  ? ? ?1.  OD vastly improved yet still chronically active serous subretinal fluid from CNVM on Eylea currently at 7-week interval we will repeat today and maintain 7-week interval to maintain acuity ? ?2. ? ?3. ? ?Ophthalmic Meds Ordered this visit:  ?Meds ordered this encounter   ?Medications  ? bevacizumab (AVASTIN) SOSY 2.5 mg  ? ? ?  ? ?Return in about 7 weeks (around 11/18/2021) for dilate, OD, EYLEA OCT. ? ?There are no Patient Instructions on file for this visit. ? ? ?[place]

## 2021-09-30 NOTE — Assessment & Plan Note (Signed)
OD at 7 weeks, chronic serous subfoveal fluid stabilized on Eylea with preserved acuity.  We will repeat injection today to maintain ?

## 2021-10-02 ENCOUNTER — Encounter (INDEPENDENT_AMBULATORY_CARE_PROVIDER_SITE_OTHER): Payer: PPO | Admitting: Ophthalmology

## 2021-11-18 ENCOUNTER — Encounter (INDEPENDENT_AMBULATORY_CARE_PROVIDER_SITE_OTHER): Payer: Self-pay | Admitting: Ophthalmology

## 2021-11-18 ENCOUNTER — Ambulatory Visit (INDEPENDENT_AMBULATORY_CARE_PROVIDER_SITE_OTHER): Payer: PPO | Admitting: Ophthalmology

## 2021-11-18 DIAGNOSIS — H353113 Nonexudative age-related macular degeneration, right eye, advanced atrophic without subfoveal involvement: Secondary | ICD-10-CM

## 2021-11-18 DIAGNOSIS — H353211 Exudative age-related macular degeneration, right eye, with active choroidal neovascularization: Secondary | ICD-10-CM

## 2021-11-18 DIAGNOSIS — H353124 Nonexudative age-related macular degeneration, left eye, advanced atrophic with subfoveal involvement: Secondary | ICD-10-CM

## 2021-11-18 MED ORDER — AFLIBERCEPT 2MG/0.05ML IZ SOLN FOR KALEIDOSCOPE
2.0000 mg | INTRAVITREAL | Status: AC | PRN
  Administered 2021-11-18: 2 mg via INTRAVITREAL

## 2021-11-18 NOTE — Assessment & Plan Note (Signed)
No sign of CNVM, may be candidate for Syfovre in the future ?

## 2021-11-18 NOTE — Assessment & Plan Note (Signed)
OD, vastly improved overall, much less subretinal fluid today at 7-week interval post Eylea injection.  We will repeat injection today to maintain these improvements and stable acuity ?

## 2021-11-18 NOTE — Progress Notes (Signed)
? ? ?11/18/2021 ? ?  ? ?CHIEF COMPLAINT ?Patient presents for  ?Chief Complaint  ?Patient presents with  ? Macular Degeneration  ? ? ? ? ?HISTORY OF PRESENT ILLNESS: ?Robert Doyle is a 88 y.o. male who presents to the clinic today for:  ? ?HPI   ?7 weeks dilate OD, Eylea OD OCT. ?Patient states vision is stable and unchanged since last visit. Denies any new floaters or FOL. OD less distorted visual lines, currently at 7-week follow-up today post Eylea ?Patient takes Preservision AREDS twice daily. ?Last edited by Hurman Horn, MD on 11/18/2021  2:24 PM.  ?  ? ? ?Referring physician: ?Warden Fillers, MD ?[address] ?[address] ?[place],  [place] [address] ? ?HISTORICAL INFORMATION:  ? ?Selected notes from the [facility] ?  ?   ? ?CURRENT MEDICATIONS: ?Current Outpatient Medications (Ophthalmic Drugs)  ?Medication Sig  ? latanoprost (XALATAN) 0.005 % ophthalmic solution Place 1 drop into both eyes daily.  ? ?No current facility-administered medications for this visit. (Ophthalmic Drugs)  ? ?Current Outpatient Medications (Other)  ?Medication Sig  ? albuterol (PROVENTIL) (2.5 MG/3ML) 0.083% nebulizer solution Take 3 mLs (2.5 mg total) by nebulization every 6 (six) hours as needed for wheezing or shortness of breath.  ? amLODipine (NORVASC) 10 MG tablet Take 10 mg by mouth daily.  ? ANORO ELLIPTA 62.5-25 MCG/INH AEPB 1 puff daily.  ? azithromycin (ZITHROMAX) 250 MG tablet Take 2 tablets today then 1 tablet daily until gone  ? Calcium Carb-Cholecalciferol (CALCIUM 600 + D PO) Take 1 tablet by mouth daily.  ? Fluticasone-Umeclidin-Vilant (TRELEGY ELLIPTA) 100-62.5-25 MCG/INH AEPB Inhale into the lungs.  ? hydrochlorothiazide (HYDRODIURIL) 12.5 MG tablet Take 12.5 mg by mouth every morning.  ? hydrochlorothiazide (HYDRODIURIL) 25 MG tablet Take 25 mg by mouth every morning.  ? ibuprofen (ADVIL) 800 MG tablet Take 800 mg by mouth 2 (two) times daily.  ? losartan (COZAAR) 50 MG tablet Take 50 mg by mouth daily.   ?  lovastatin (MEVACOR) 40 MG tablet Take 40 mg by mouth every morning.   ? Multiple Vitamins-Minerals (MULTIVITAMIN PO) Take 1 tablet by mouth daily.  ? Multiple Vitamins-Minerals (PRESERVISION AREDS PO) Take 1 tablet by mouth 2 (two) times daily.  ? Omega-3 Fatty Acids (FISH OIL) 1200 MG CAPS Take 1,200 mg by mouth daily.  ? ?No current facility-administered medications for this visit. (Other)  ? ? ? ? ?REVIEW OF SYSTEMS: ?ROS   ?Negative for: Constitutional, Gastrointestinal, Neurological, Skin, Genitourinary, Musculoskeletal, HENT, Endocrine, Cardiovascular, Eyes, Respiratory, Psychiatric, Allergic/Imm, Heme/Lymph ?Last edited by Hurman Horn, MD on 11/18/2021  2:24 PM.  ?  ? ? ? ?ALLERGIES ?No Known Allergies ? ?PAST MEDICAL HISTORY ?Past Medical History:  ?Diagnosis Date  ? Allergic rhinitis   ? Asthma   ? COPD (chronic obstructive pulmonary disease) ([place])   ? Hypertension   ? Macular degeneration   ? ?Past Surgical History:  ?Procedure Laterality Date  ? CATARACT EXTRACTION W/PHACO Left 11/03/2016  ? Dr. Katy Fitch  ? CATARACT EXTRACTION W/PHACO Right 11/22/2016  ? Dr. Katy Fitch  ? COLONOSCOPY WITH PROPOFOL N/A 03/12/2014  ? Procedure: COLONOSCOPY WITH PROPOFOL;  Surgeon: Garlan Fair, MD;  Location: [facility];  Service: Endoscopy;  Laterality: N/A;  ? LUMBAR SPINE SURGERY    ? x 2 times  ? repair deviated septum  40-50 yrs ago  ? ROTATOR CUFF REPAIR Left   ? ? ?FAMILY HISTORY ?Family History  ?Problem Relation Age of Onset  ?  Prostate cancer Father   ? Macular degeneration Mother   ? ? ?SOCIAL HISTORY ?Social History  ? ?Tobacco Use  ? Smoking status: Former  ?  Packs/day: 2.00  ?  Years: 15.00  ?  Pack years: 30.00  ?  Types: Cigarettes  ?  Quit date: 07/14/1963  ?  Years since quitting: 58.3  ? Smokeless tobacco: Never  ?Substance Use Topics  ? Alcohol use: No  ? Drug use: No  ? ?  ? ?  ? ?OPHTHALMIC EXAM: ? ?Base Eye Exam   ? ? Visual Acuity (ETDRS)   ? ?   Right Left  ? Dist [place] 20/25 -2 20/30 -1  ? ?  ?  ? ?  Tonometry (Tonopen, 1:33 PM)   ? ?   Right Left  ? Pressure 9 8  ? ?  ?  ? ? Pachymetry   ? ? 06/26/2021  ? ?  ?  ? ? Pupils   ? ?   Pupils APD  ? Right PERRL None  ? Left PERRL None  ? ?  ?  ? ? Extraocular Movement   ? ?   Right Left  ?  Full Full  ? ?  ?  ? ? Neuro/Psych   ? ? Oriented x3: Yes  ? Mood/Affect: Normal  ? ?  ?  ? ? Dilation   ? ? Right eye: 1.0% Mydriacyl, 2.5% Phenylephrine @ 1:33 PM  ? ?  ?  ? ?  ? ?Slit Lamp and Fundus Exam   ? ? External Exam   ? ?   Right Left  ? External Normal Normal  ? ?  ?  ? ? Slit Lamp Exam   ? ?   Right Left  ? Lids/Lashes Normal Normal  ? Conjunctiva/Sclera White and quiet White and quiet  ? Cornea Clear Clear  ? Anterior Chamber Deep and quiet Deep and quiet  ? Iris Round and reactive Round and reactive  ? Lens Posterior chamber intraocular lens Posterior chamber intraocular lens  ? Anterior Vitreous Normal Normal  ? ?  ?  ? ? Fundus Exam   ? ?   Right Left  ? Posterior Vitreous Posterior vitreous detachment   ? Disc Normal   ? C/D Ratio 0.5   ? Macula Retinal pigment epithelial mottling, serous macular thickening, no hemorrhage, no exudates, Pigmented atrophy   ? Vessels Normal, no signs of Hollenhorst plaque seen, as it was noted recent examination on 04/02/2020 and that plaque was present between eye nevus superotemporally and the macula   ? Periphery Normal, small flat nevus along the superotemporal arcade   ? ?  ?  ? ?  ? ? ?IMAGING AND PROCEDURES  ?Imaging and Procedures for 11/18/21 ? ?Intravitreal Injection, Pharmacologic Agent - OD - Right Eye   ? ?   ?Time Out ?11/18/2021. 2:27 PM. Confirmed correct patient, procedure, site, and patient consented.  ? ?Anesthesia ?Topical anesthesia was used. Anesthetic medications included Lidocaine 4%.  ? ?Procedure ?Preparation included Tobramycin 0.3%, 10% betadine to eyelids, 5% betadine to ocular surface. A 30 gauge needle was used.  ? ?Injection: ?2 mg aflibercept 2 MG/0.05ML ?  Route: Intravitreal, Site: Right Eye ?  [place]:  [id number], Lot: [id number], Waste: 0 mL  ? ?Post-op ?Post injection exam found visual acuity of at least counting fingers, perfused optic nerve. The patient tolerated the procedure well. There were no complications. The patient received written and verbal post procedure care education. Post injection  medications included ocuflox.  ? ?Notes ?Some dimness post injection, patient weight in the office 3 minutes ? ?  ? ?OCT, Retina - OU - Both Eyes   ? ?   ?Right Eye ?Quality was good. Scan locations included subfoveal. Central Foveal Thickness: 311. Progression has improved. Findings include subretinal fluid, disciform scar, choroidal neovascular membrane, abnormal foveal contour.  ? ?Left Eye ?Quality was good. Scan locations included subfoveal. Central Foveal Thickness: 274. Progression has been stable. Findings include abnormal foveal contour, intraretinal hyper-reflective material, subretinal hyper-reflective material.  ? ?Notes ?OD, improved overall CNVM with less subretinal fluid through the FAZ, at 7-week interval.  We will maintain interval next to 6-7 weeks and and will tolerate subretinal fluid if no impact on acuity and not worsening next, improved OD at shorter interrval, stable overall ? ?No signs of active CNVM OS ? ? ? ? ? ?  ? ? ?  ?  ? ?  ?ASSESSMENT/PLAN: ? ?Exudative age-related macular degeneration of right eye with active choroidal neovascularization ([place]) ?OD, vastly improved overall, much less subretinal fluid today at 7-week interval post Eylea injection.  We will repeat injection today to maintain these improvements and stable acuity ? ?Advanced nonexudative age-related macular degeneration of right eye without subfoveal involvement ?OD, dry AMD may be candidate for Syfovre ? ?Advanced nonexudative age-related macular degeneration of left eye with subfoveal involvement ?No sign of CNVM, may be candidate for Syfovre in the future  ? ?  ICD-10-CM   ?1. Exudative age-related macular degeneration  of right eye with active choroidal neovascularization ([facility])  H35.3211 Intravitreal Injection, Pharmacologic Agent - OD - Right Eye  ?  OCT, Retina - OU - Both Eyes  ?  aflibercept (EYLEA) SOLN 2 mg  ?

## 2021-11-18 NOTE — Assessment & Plan Note (Signed)
OD, dry AMD may be candidate for Syfovre ?

## 2022-01-06 ENCOUNTER — Ambulatory Visit (INDEPENDENT_AMBULATORY_CARE_PROVIDER_SITE_OTHER): Payer: PPO | Admitting: Ophthalmology

## 2022-01-06 ENCOUNTER — Encounter (INDEPENDENT_AMBULATORY_CARE_PROVIDER_SITE_OTHER): Payer: Self-pay | Admitting: Ophthalmology

## 2022-01-06 DIAGNOSIS — H353113 Nonexudative age-related macular degeneration, right eye, advanced atrophic without subfoveal involvement: Secondary | ICD-10-CM

## 2022-01-06 DIAGNOSIS — H353124 Nonexudative age-related macular degeneration, left eye, advanced atrophic with subfoveal involvement: Secondary | ICD-10-CM

## 2022-01-06 DIAGNOSIS — H35722 Serous detachment of retinal pigment epithelium, left eye: Secondary | ICD-10-CM

## 2022-01-06 DIAGNOSIS — H353211 Exudative age-related macular degeneration, right eye, with active choroidal neovascularization: Secondary | ICD-10-CM | POA: Diagnosis not present

## 2022-01-06 MED ORDER — AFLIBERCEPT 2MG/0.05ML IZ SOLN FOR KALEIDOSCOPE
2.0000 mg | INTRAVITREAL | Status: AC | PRN
  Administered 2022-01-06: 2 mg via INTRAVITREAL

## 2022-01-06 NOTE — Assessment & Plan Note (Signed)
No signs of atrophy

## 2022-01-06 NOTE — Assessment & Plan Note (Signed)
With persistent subretinal fluid

## 2022-02-16 ENCOUNTER — Encounter (INDEPENDENT_AMBULATORY_CARE_PROVIDER_SITE_OTHER): Payer: Self-pay | Admitting: Ophthalmology

## 2022-02-16 ENCOUNTER — Ambulatory Visit (INDEPENDENT_AMBULATORY_CARE_PROVIDER_SITE_OTHER): Payer: PPO | Admitting: Ophthalmology

## 2022-02-16 DIAGNOSIS — H353221 Exudative age-related macular degeneration, left eye, with active choroidal neovascularization: Secondary | ICD-10-CM | POA: Diagnosis not present

## 2022-02-16 DIAGNOSIS — H353211 Exudative age-related macular degeneration, right eye, with active choroidal neovascularization: Secondary | ICD-10-CM | POA: Diagnosis not present

## 2022-02-16 MED ORDER — AFLIBERCEPT 2MG/0.05ML IZ SOLN FOR KALEIDOSCOPE
2.0000 mg | INTRAVITREAL | Status: AC | PRN
  Administered 2022-02-16: 2 mg via INTRAVITREAL

## 2022-02-16 NOTE — Assessment & Plan Note (Signed)
Will need to commence therapy promptly within 1 week preferably OS

## 2022-02-16 NOTE — Assessment & Plan Note (Signed)
OD, much less subretinal fluid today at 6 weeks interval follow-up post Eylea.  Repeat injection today to maintain.

## 2022-02-16 NOTE — Progress Notes (Signed)
02/16/2022     CHIEF COMPLAINT Patient presents for  Chief Complaint  Patient presents with   Macular Degeneration      HISTORY OF PRESENT ILLNESS: Robert Doyle is a 88 y.o. male who presents to the clinic today for:   HPI   6 weeks dilate OD Eylea OCT,, with slight waviness to the straight lines OD  OS also now has new onset wavy lines  Pt states his vision has not been stable Pt denies any new floaters or FOL  Pt states his vision has been a bit more blurry Pt states if he looks at a straight line it has a "crook in it"  Last edited by Hurman Horn, MD on 02/16/2022  1:32 PM.      Referring physician: Lajean Manes, MD [address],  [place] [address]  HISTORICAL INFORMATION:   Selected notes from the MEDICAL RECORD NUMBER       CURRENT MEDICATIONS: Current Outpatient Medications (Ophthalmic Drugs)  Medication Sig   latanoprost (XALATAN) 0.005 % ophthalmic solution Place 1 drop into both eyes daily.   No current facility-administered medications for this visit. (Ophthalmic Drugs)   Current Outpatient Medications (Other)  Medication Sig   albuterol (PROVENTIL) (2.5 MG/3ML) 0.083% nebulizer solution Take 3 mLs (2.5 mg total) by nebulization every 6 (six) hours as needed for wheezing or shortness of breath.   amLODipine (NORVASC) 10 MG tablet Take 10 mg by mouth daily.   ANORO ELLIPTA 62.5-25 MCG/INH AEPB 1 puff daily.   azithromycin (ZITHROMAX) 250 MG tablet Take 2 tablets today then 1 tablet daily until gone   Calcium Carb-Cholecalciferol (CALCIUM 600 + D PO) Take 1 tablet by mouth daily.   Fluticasone-Umeclidin-Vilant (TRELEGY ELLIPTA) 100-62.5-25 MCG/INH AEPB Inhale into the lungs.   hydrochlorothiazide (HYDRODIURIL) 12.5 MG tablet Take 12.5 mg by mouth every morning.   hydrochlorothiazide (HYDRODIURIL) 25 MG tablet Take 25 mg by mouth every morning.   ibuprofen (ADVIL) 800 MG tablet Take 800 mg by mouth 2 (two) times daily.   losartan (COZAAR)  50 MG tablet Take 50 mg by mouth daily.    lovastatin (MEVACOR) 40 MG tablet Take 40 mg by mouth every morning.    Multiple Vitamins-Minerals (MULTIVITAMIN PO) Take 1 tablet by mouth daily.   Multiple Vitamins-Minerals (PRESERVISION AREDS PO) Take 1 tablet by mouth 2 (two) times daily.   Omega-3 Fatty Acids (FISH OIL) 1200 MG CAPS Take 1,200 mg by mouth daily.   No current facility-administered medications for this visit. (Other)      REVIEW OF SYSTEMS: ROS   Negative for: Constitutional, Gastrointestinal, Neurological, Skin, Genitourinary, Musculoskeletal, HENT, Endocrine, Cardiovascular, Eyes, Respiratory, Psychiatric, Allergic/Imm, Heme/Lymph Last edited by Orene Desanctis D, CMA on 02/16/2022  1:15 PM.       ALLERGIES No Known Allergies  PAST MEDICAL HISTORY Past Medical History:  Diagnosis Date   Allergic rhinitis    Asthma    COPD (chronic obstructive pulmonary disease) ([place])    Hypertension    Macular degeneration    Past Surgical History:  Procedure Laterality Date   CATARACT EXTRACTION W/PHACO Left 11/03/2016   Dr. Katy Fitch   CATARACT EXTRACTION W/PHACO Right 11/22/2016   Dr. Katy Fitch   COLONOSCOPY WITH PROPOFOL N/A 03/12/2014   Procedure: COLONOSCOPY WITH PROPOFOL;  Surgeon: Garlan Fair, MD;  Location: [facility];  Service: Endoscopy;  Laterality: N/A;   LUMBAR SPINE SURGERY     x 2 times   repair deviated septum  40-50  yrs ago   ROTATOR CUFF REPAIR Left     FAMILY HISTORY Family History  Problem Relation Age of Onset   Prostate cancer Father    Macular degeneration Mother     SOCIAL HISTORY Social History   Tobacco Use   Smoking status: Former    Packs/day: 2.00    Years: 15.00    Total pack years: 30.00    Types: Cigarettes    Quit date: 07/14/1963    Years since quitting: 58.6   Smokeless tobacco: Never  Substance Use Topics   Alcohol use: No   Drug use: No         OPHTHALMIC EXAM:  Base Eye Exam     Visual Acuity (ETDRS)        Right Left   Dist [place] 20/25 +2 20/25         Tonometry (Tonopen, 1:20 PM)       Right Left   Pressure 11 14         Pachymetry     06/26/2021         Pupils       Shape   Right Round   Left          Extraocular Movement       Right Left    Ortho Ortho    -- -- --  --  --  -- -- --   -- -- --  --  --  -- -- --           Neuro/Psych     Oriented x3: Yes   Mood/Affect: Normal         Dilation     Right eye: 2.5% Phenylephrine, 1.0% Mydriacyl @ 1:17 PM           Slit Lamp and Fundus Exam     External Exam       Right Left   External Normal Normal         Slit Lamp Exam       Right Left   Lids/Lashes Normal Normal   Conjunctiva/Sclera White and quiet White and quiet   Cornea Clear Clear   Anterior Chamber Deep and quiet Deep and quiet   Iris Round and reactive Round and reactive   Lens Posterior chamber intraocular lens Posterior chamber intraocular lens   Anterior Vitreous Normal Normal         Fundus Exam       Right Left   Posterior Vitreous Posterior vitreous detachment    Disc Normal    C/D Ratio 0.5    Macula Retinal pigment epithelial mottling, serous macular thickening, no hemorrhage, no exudates, Pigmented atrophy    Vessels Normal, no signs of Hollenhorst plaque seen, as it was noted recent examination on 04/02/2020 and that plaque was present between eye nevus superotemporally and the macula    Periphery Normal, small flat nevus along the superotemporal arcade             IMAGING AND PROCEDURES  Imaging and Procedures for 02/16/22  OCT, Retina - OU - Both Eyes       Right Eye Quality was good. Scan locations included subfoveal. Central Foveal Thickness: 310. Findings include abnormal foveal contour, choroidal neovascular membrane, disciform scar, subretinal fluid.   Left Eye Quality was good. Scan locations included subfoveal. Central Foveal Thickness: 274. Progression has been stable. Findings include  abnormal foveal contour, subretinal hyper-reflective material, intraretinal hyper-reflective material.   Notes OD, improved overall CNVM  with less subretinal fluid through the FAZ, at 7-week interval.  We will maintain interval next to 6 weeks and and will tolerate subretinal fluid if no impact on acuity and not worsening next, improved OD at shorter interrval, stable overall  OS with area superonasal to [place] with enlarging apparent subfoveal CNVM as compared to last visit.  We will need to commence therapy OS after dilate examination within 1 week OS          Intravitreal Injection, Pharmacologic Agent - OD - Right Eye       Time Out 02/16/2022. 1:33 PM. Confirmed correct patient, procedure, site, and patient consented.   Anesthesia Topical anesthesia was used. Anesthetic medications included Lidocaine 4%.   Procedure Preparation included 5% betadine to ocular surface, 10% betadine to eyelids, Tobramycin 0.3%. A 30 gauge needle was used.   Injection: 2 mg aflibercept 2 MG/0.05ML   Route: Intravitreal, Site: Right Eye   NDC: [id number], Lot: [id number], Expiration date: 03/15/2023, Waste: 0 mL   Post-op Post injection exam found visual acuity of at least counting fingers, perfused optic nerve. The patient tolerated the procedure well. There were no complications. The patient received written and verbal post procedure care education. Post injection medications included ocuflox.   Notes Some dimness post injection, patient weight in the office 3 minutes              ASSESSMENT/PLAN:  Exudative age-related macular degeneration of right eye with active choroidal neovascularization (HCC) OD, much less subretinal fluid today at 6 weeks interval follow-up post Eylea.  Repeat injection today to maintain.  Exudative age-related macular degeneration of left eye with active choroidal neovascularization ([place]) Will need to commence therapy promptly within 1 week preferably OS      ICD-10-CM   1. Exudative age-related macular degeneration of right eye with active choroidal neovascularization (HCC)  H35.3211 OCT, Retina - OU - Both Eyes    Intravitreal Injection, Pharmacologic Agent - OD - Right Eye    aflibercept (EYLEA) SOLN 2 mg    2. Exudative age-related macular degeneration of left eye with active choroidal neovascularization ([place])  H35.3221       1.  OD serous retinal detachment discomforting wet AMD improved at shorter interval follow-up of 6 weeks as compared to 7 weeks in the past.  Post Eylea.  We will repeat injection today to maintain  2.  OS today new finding, likely CNVM as patient is symptomatic with new onset region superonasal to [place].  Will need dilate examination left eye and likely commence Avastin OS within a week  3.  Ophthalmic Meds Ordered this visit:  Meds ordered this encounter  Medications   aflibercept (EYLEA) SOLN 2 mg       Return in about 2 days (around 02/18/2022) for dilate, OS, AVASTIN OCT,, and dilate OD Eylea OCT 6 weeks.  There are no Patient Instructions on file for this visit.   Explained the diagnoses, plan, and follow up with the patient and they expressed understanding.  Patient expressed understanding of the importance of proper follow up care.   Clent Demark Rui Wordell M.D. Diseases & Surgery of the Retina and Vitreous Retina & Diabetic [place] 02/16/22     Abbreviations: M myopia (nearsighted); A astigmatism; H hyperopia (farsighted); P presbyopia; Mrx spectacle prescription;  CTL contact lenses; OD right eye; OS left eye; OU both eyes  XT exotropia; ET esotropia; PEK punctate epithelial keratitis; PEE punctate epithelial erosions; DES dry eye syndrome; MGD meibomian gland dysfunction;  ATs artificial tears; PFAT's preservative free artificial tears; [place] nuclear sclerotic cataract; PSC posterior subcapsular cataract; ERM epi-retinal membrane; PVD posterior vitreous detachment; RD retinal detachment; DM diabetes mellitus;  DR diabetic retinopathy; NPDR non-proliferative diabetic retinopathy; PDR proliferative diabetic retinopathy; CSME clinically significant macular edema; DME diabetic macular edema; dbh dot blot hemorrhages; CWS cotton wool spot; POAG primary open angle glaucoma; C/D cup-to-disc ratio; HVF humphrey visual field; GVF goldmann visual field; OCT optical coherence tomography; IOP intraocular pressure; BRVO Branch retinal vein occlusion; CRVO central retinal vein occlusion; CRAO central retinal artery occlusion; BRAO branch retinal artery occlusion; RT retinal tear; SB scleral buckle; PPV pars plana vitrectomy; VH Vitreous hemorrhage; PRP panretinal laser photocoagulation; IVK intravitreal kenalog; VMT vitreomacular traction; MH Macular hole;  NVD neovascularization of the disc; NVE neovascularization elsewhere; AREDS age related eye disease study; ARMD age related macular degeneration; POAG primary open angle glaucoma; EBMD epithelial/anterior basement membrane dystrophy; ACIOL anterior chamber intraocular lens; IOL intraocular lens; PCIOL posterior chamber intraocular lens; Phaco/IOL phacoemulsification with intraocular lens placement; [place] photorefractive keratectomy; LASIK laser assisted in situ keratomileusis; HTN hypertension; DM diabetes mellitus; COPD chronic obstructive pulmonary disease

## 2022-02-17 ENCOUNTER — Encounter (INDEPENDENT_AMBULATORY_CARE_PROVIDER_SITE_OTHER): Payer: PPO | Admitting: Ophthalmology

## 2022-02-18 ENCOUNTER — Encounter (INDEPENDENT_AMBULATORY_CARE_PROVIDER_SITE_OTHER): Payer: Self-pay | Admitting: Ophthalmology

## 2022-02-18 ENCOUNTER — Ambulatory Visit (INDEPENDENT_AMBULATORY_CARE_PROVIDER_SITE_OTHER): Payer: PPO | Admitting: Ophthalmology

## 2022-02-18 DIAGNOSIS — H353221 Exudative age-related macular degeneration, left eye, with active choroidal neovascularization: Secondary | ICD-10-CM

## 2022-02-18 DIAGNOSIS — H353211 Exudative age-related macular degeneration, right eye, with active choroidal neovascularization: Secondary | ICD-10-CM | POA: Diagnosis not present

## 2022-02-18 MED ORDER — BEVACIZUMAB CHEMO INJECTION 1.25MG/0.05ML SYRINGE FOR KALEIDOSCOPE
1.2500 mg | INTRAVITREAL | Status: AC | PRN
  Administered 2022-02-18: 1.25 mg via INTRAVITREAL

## 2022-02-18 NOTE — Progress Notes (Signed)
02/18/2022     CHIEF COMPLAINT Patient presents for  Chief Complaint  Patient presents with   Macular Degeneration    New onset CNVM found 2 days previous.  Commencement of therapy OS  HISTORY OF PRESENT ILLNESS: Robert Doyle is a 88 y.o. male who presents to the clinic today for:   HPI   2 days for DILATE OS, AVASTIN OCT. Pt stated vision remained stable since last visit.  Last edited by Silvestre Moment on 02/18/2022 10:30 AM.      Referring physician: Lajean Manes, MD [address] [place],  [place] [address]  HISTORICAL INFORMATION:   Selected notes from the MEDICAL RECORD NUMBER       CURRENT MEDICATIONS: Current Outpatient Medications (Ophthalmic Drugs)  Medication Sig   latanoprost (XALATAN) 0.005 % ophthalmic solution Place 1 drop into both eyes daily.   No current facility-administered medications for this visit. (Ophthalmic Drugs)   Current Outpatient Medications (Other)  Medication Sig   albuterol (PROVENTIL) (2.5 MG/3ML) 0.083% nebulizer solution Take 3 mLs (2.5 mg total) by nebulization every 6 (six) hours as needed for wheezing or shortness of breath.   amLODipine (NORVASC) 10 MG tablet Take 10 mg by mouth daily.   ANORO ELLIPTA 62.5-25 MCG/INH AEPB 1 puff daily.   azithromycin (ZITHROMAX) 250 MG tablet Take 2 tablets today then 1 tablet daily until gone   Calcium Carb-Cholecalciferol (CALCIUM 600 + D PO) Take 1 tablet by mouth daily.   Fluticasone-Umeclidin-Vilant (TRELEGY ELLIPTA) 100-62.5-25 MCG/INH AEPB Inhale into the lungs.   hydrochlorothiazide (HYDRODIURIL) 12.5 MG tablet Take 12.5 mg by mouth every morning.   hydrochlorothiazide (HYDRODIURIL) 25 MG tablet Take 25 mg by mouth every morning.   ibuprofen (ADVIL) 800 MG tablet Take 800 mg by mouth 2 (two) times daily.   losartan (COZAAR) 50 MG tablet Take 50 mg by mouth daily.    lovastatin (MEVACOR) 40 MG tablet Take 40 mg by mouth every morning.    Multiple Vitamins-Minerals (MULTIVITAMIN  PO) Take 1 tablet by mouth daily.   Multiple Vitamins-Minerals (PRESERVISION AREDS PO) Take 1 tablet by mouth 2 (two) times daily.   Omega-3 Fatty Acids (FISH OIL) 1200 MG CAPS Take 1,200 mg by mouth daily.   No current facility-administered medications for this visit. (Other)      REVIEW OF SYSTEMS: ROS   Negative for: Constitutional, Gastrointestinal, Neurological, Skin, Genitourinary, Musculoskeletal, HENT, Endocrine, Cardiovascular, Eyes, Respiratory, Psychiatric, Allergic/Imm, Heme/Lymph Last edited by Silvestre Moment on 02/18/2022 10:30 AM.       ALLERGIES No Known Allergies  PAST MEDICAL HISTORY Past Medical History:  Diagnosis Date   Allergic rhinitis    Asthma    COPD (chronic obstructive pulmonary disease) ([place])    Hypertension    Macular degeneration    Past Surgical History:  Procedure Laterality Date   CATARACT EXTRACTION W/PHACO Left 11/03/2016   Dr. Katy Fitch   CATARACT EXTRACTION W/PHACO Right 11/22/2016   Dr. Katy Fitch   COLONOSCOPY WITH PROPOFOL N/A 03/12/2014   Procedure: COLONOSCOPY WITH PROPOFOL;  Surgeon: Garlan Fair, MD;  Location: [facility];  Service: Endoscopy;  Laterality: N/A;   LUMBAR SPINE SURGERY     x 2 times   repair deviated septum  40-50 yrs ago   ROTATOR CUFF REPAIR Left     FAMILY HISTORY Family History  Problem Relation Age of Onset   Prostate cancer Father    Macular degeneration Mother     SOCIAL HISTORY Social History  Tobacco Use   Smoking status: Former    Packs/day: 2.00    Years: 15.00    Total pack years: 30.00    Types: Cigarettes    Quit date: 07/14/1963    Years since quitting: 58.6   Smokeless tobacco: Never  Substance Use Topics   Alcohol use: No   Drug use: No         OPHTHALMIC EXAM:  Base Eye Exam     Visual Acuity (ETDRS)       Right Left   Dist [place] 20/30 -2 20/30 -2   Dist ph [place] 20/25 -2 20/25 +2         Tonometry (Tonopen, 10:35 AM)       Right Left   Pressure 13 12         Pachymetry      06/26/2021         Pupils       Pupils APD   Right PERRL None   Left PERRL None         Visual Fields       Left Right    Full Full         Extraocular Movement       Right Left    Full, Ortho Full, Ortho         Neuro/Psych     Oriented x3: Yes   Mood/Affect: Normal         Dilation     Left eye: 2.5% Phenylephrine, 1.0% Mydriacyl @ 10:35 AM           Slit Lamp and Fundus Exam     External Exam       Right Left   External Normal Normal         Slit Lamp Exam       Right Left   Lids/Lashes Normal Normal   Conjunctiva/Sclera White and quiet White and quiet   Cornea Clear Clear   Anterior Chamber Deep and quiet Deep and quiet   Iris Round and reactive Round and reactive   Lens Posterior chamber intraocular lens Posterior chamber intraocular lens   Anterior Vitreous Normal Normal         Fundus Exam       Right Left   Posterior Vitreous  Posterior vitreous detachment   Disc  Normal   C/D Ratio  0.5   Macula  Retinal pigment epithelial detachment, small, no hemorrhage, Macular thickening, Exudates   Vessels  Normal   Periphery  Normal            IMAGING AND PROCEDURES  Imaging and Procedures for 02/18/22  OCT, Retina - OU - Both Eyes       Right Eye Quality was good. Scan locations included subfoveal. Central Foveal Thickness: 297. Findings include abnormal foveal contour, choroidal neovascular membrane, disciform scar, subretinal fluid.   Left Eye Quality was good. Scan locations included subfoveal. Central Foveal Thickness: 285. Progression has been stable. Findings include abnormal foveal contour, subretinal hyper-reflective material, intraretinal hyper-reflective material.   Notes OD, improved overall CNVM with less subretinal fluid through the FAZ, at recent 7-week interval.  Got 2 days postinjection left subretinal fluid  We will maintain interval next to 6 weeks and and will tolerate subretinal fluid if no  impact on acuity and not worsening next, improved OD at shorter interrval, stable overall  OS with new area of CNVM superonasal FAZ, will commence therapy with Avastin OS today  Intravitreal Injection, Pharmacologic Agent - OS - Left Eye       Time Out 02/18/2022. 10:46 AM. Confirmed correct patient, procedure, site, and patient consented.   Anesthesia Topical anesthesia was used. Anesthetic medications included Lidocaine 4%.   Procedure Preparation included 5% betadine to ocular surface, 10% betadine to eyelids. A 30 gauge needle was used.   Injection: 1.25 mg Bevacizumab 1.'25mg'$ /0.60m   Route: Intravitreal, Site: Left Eye   NDC: [id number] Lot:: [id number] Expiration date: 05/15/2022   Post-op Post injection exam found visual acuity of at least counting fingers. The patient tolerated the procedure well. There were no complications. The patient received written and verbal post procedure care education. Post injection medications included gentamicin.              ASSESSMENT/PLAN:  Exudative age-related macular degeneration of right eye with active choroidal neovascularization (HCC) 6 slightly less subretinal fluid 2 days after recent injection antivegF follow-up as scheduled  Exudative age-related macular degeneration of left eye with active choroidal neovascularization ([place] New onset CNVM OS today.  Commence therapy with Avastin initially.  Follow-up in 5 weeks for repeat evaluation      ICD-10-CM   1. Exudative age-related macular degeneration of left eye with active choroidal neovascularization (HCC)  H35.3221 OCT, Retina - OU - Both Eyes    Intravitreal Injection, Pharmacologic Agent - OS - Left Eye    Bevacizumab (AVASTIN) SOLN 1.25 mg    2. Exudative age-related macular degeneration of right eye with active choroidal neovascularization ([place]  H35.3211       1.  OS with new onset CNVM superonasal FAZ found 2 days previously on follow-up of right eye  and injection right eye.  2.  Commence therapy today with intravitreal Avastin OS.  Follow-up OS in 5-week  3.  OD follow-up as scheduled  Ophthalmic Meds Ordered this visit:  Meds ordered this encounter  Medications   Bevacizumab (AVASTIN) SOLN 1.25 mg       Return in about 5 weeks (around 03/25/2022) for dilate, OS, AVASTIN OCT.  There are no Patient Instructions on file for this visit.   Explained the diagnoses, plan, and follow up with the patient and they expressed understanding.  Patient expressed understanding of the importance of proper follow up care.   GClent DemarkRankin M.D. Diseases & Surgery of the Retina and Vitreous Retina & Diabetic [place]08/09/23     Abbreviations: M myopia (nearsighted); A astigmatism; H hyperopia (farsighted); P presbyopia; Mrx spectacle prescription;  CTL contact lenses; OD right eye; OS left eye; OU both eyes  XT exotropia; ET esotropia; PEK punctate epithelial keratitis; PEE punctate epithelial erosions; DES dry eye syndrome; MGD meibomian gland dysfunction; ATs artificial tears; PFAT's preservative free artificial tears; [place]uclear sclerotic cataract; PSC posterior subcapsular cataract; ERM epi-retinal membrane; PVD posterior vitreous detachment; RD retinal detachment; DM diabetes mellitus; DR diabetic retinopathy; NPDR non-proliferative diabetic retinopathy; PDR proliferative diabetic retinopathy; CSME clinically significant macular edema; DME diabetic macular edema; dbh dot blot hemorrhages; CWS cotton wool spot; POAG primary open angle glaucoma; C/D cup-to-disc ratio; HVF humphrey visual field; GVF goldmann visual field; OCT optical coherence tomography; IOP intraocular pressure; BRVO Branch retinal vein occlusion; CRVO central retinal vein occlusion; CRAO central retinal artery occlusion; BRAO branch retinal artery occlusion; RT retinal tear; SB scleral buckle; PPV pars plana vitrectomy; VH Vitreous hemorrhage; PRP panretinal laser  photocoagulation; IVK intravitreal kenalog; VMT vitreomacular traction; MH Macular hole;  NVD neovascularization of the disc; NVE neovascularization elsewhere; AREDS  age related eye disease study; ARMD age related macular degeneration; POAG primary open angle glaucoma; EBMD epithelial/anterior basement membrane dystrophy; ACIOL anterior chamber intraocular lens; IOL intraocular lens; PCIOL posterior chamber intraocular lens; Phaco/IOL phacoemulsification with intraocular lens placement; [place] photorefractive keratectomy; LASIK laser assisted in situ keratomileusis; HTN hypertension; DM diabetes mellitus; COPD chronic obstructive pulmonary disease

## 2022-02-18 NOTE — Assessment & Plan Note (Signed)
6 slightly less subretinal fluid 2 days after recent injection antivegF follow-up as scheduled

## 2022-02-18 NOTE — Assessment & Plan Note (Signed)
New onset CNVM OS today.  Commence therapy with Avastin initially.  Follow-up in 5 weeks for repeat evaluation

## 2022-02-25 DIAGNOSIS — I129 Hypertensive chronic kidney disease with stage 1 through stage 4 chronic kidney disease, or unspecified chronic kidney disease: Secondary | ICD-10-CM | POA: Diagnosis not present

## 2022-02-25 DIAGNOSIS — H3530 Unspecified macular degeneration: Secondary | ICD-10-CM | POA: Diagnosis not present

## 2022-02-25 DIAGNOSIS — N1831 Chronic kidney disease, stage 3a: Secondary | ICD-10-CM | POA: Diagnosis not present

## 2022-02-25 DIAGNOSIS — J449 Chronic obstructive pulmonary disease, unspecified: Secondary | ICD-10-CM | POA: Diagnosis not present

## 2022-03-18 ENCOUNTER — Encounter (INDEPENDENT_AMBULATORY_CARE_PROVIDER_SITE_OTHER): Payer: Self-pay

## 2022-03-18 DIAGNOSIS — H532 Diplopia: Secondary | ICD-10-CM | POA: Diagnosis not present

## 2022-03-18 DIAGNOSIS — H0102A Squamous blepharitis right eye, upper and lower eyelids: Secondary | ICD-10-CM | POA: Diagnosis not present

## 2022-03-18 DIAGNOSIS — H353122 Nonexudative age-related macular degeneration, left eye, intermediate dry stage: Secondary | ICD-10-CM | POA: Diagnosis not present

## 2022-03-18 DIAGNOSIS — H401132 Primary open-angle glaucoma, bilateral, moderate stage: Secondary | ICD-10-CM | POA: Diagnosis not present

## 2022-03-18 DIAGNOSIS — Z961 Presence of intraocular lens: Secondary | ICD-10-CM | POA: Diagnosis not present

## 2022-03-18 DIAGNOSIS — H34211 Partial retinal artery occlusion, right eye: Secondary | ICD-10-CM | POA: Diagnosis not present

## 2022-03-18 DIAGNOSIS — H353211 Exudative age-related macular degeneration, right eye, with active choroidal neovascularization: Secondary | ICD-10-CM | POA: Diagnosis not present

## 2022-03-18 DIAGNOSIS — H0102B Squamous blepharitis left eye, upper and lower eyelids: Secondary | ICD-10-CM | POA: Diagnosis not present

## 2022-03-25 ENCOUNTER — Ambulatory Visit (INDEPENDENT_AMBULATORY_CARE_PROVIDER_SITE_OTHER): Payer: PPO | Admitting: Ophthalmology

## 2022-03-25 ENCOUNTER — Encounter (INDEPENDENT_AMBULATORY_CARE_PROVIDER_SITE_OTHER): Payer: Self-pay | Admitting: Ophthalmology

## 2022-03-25 DIAGNOSIS — H353221 Exudative age-related macular degeneration, left eye, with active choroidal neovascularization: Secondary | ICD-10-CM

## 2022-03-25 DIAGNOSIS — H353211 Exudative age-related macular degeneration, right eye, with active choroidal neovascularization: Secondary | ICD-10-CM | POA: Diagnosis not present

## 2022-03-25 DIAGNOSIS — H35721 Serous detachment of retinal pigment epithelium, right eye: Secondary | ICD-10-CM

## 2022-03-25 MED ORDER — BEVACIZUMAB CHEMO INJECTION 1.25MG/0.05ML SYRINGE FOR KALEIDOSCOPE
1.2500 mg | INTRAVITREAL | Status: AC | PRN
  Administered 2022-03-25: 1.25 mg via INTRAVITREAL

## 2022-03-25 NOTE — Assessment & Plan Note (Signed)
Component of wet AMD

## 2022-03-25 NOTE — Assessment & Plan Note (Signed)
Improved overall post injection Avastin repeat injection today reevaluate next in 6 weeks

## 2022-03-25 NOTE — Progress Notes (Signed)
03/25/2022     CHIEF COMPLAINT Patient presents for  Chief Complaint  Patient presents with   Macular Degeneration      HISTORY OF PRESENT ILLNESS: Robert Doyle is a 88 y.o. male who presents to the clinic today for:   HPI   Of both eyes.,  OD at current 5 weeks postinjection and OS current 5 weeks postinjection after new onset therapy began OS last visit  Macular degeneration left eye 5 weeks dilate os avastin oct Pt states his vision has been stable Pt denies any new floaters or FOL Pt states his vision is blurry Pt states when he wakes up and looks up its very dark but if he blinks a few times it starts to clear up Last edited by Hurman Horn, MD on 03/25/2022 10:46 AM.      Referring physician: Lajean Manes, MD [address] [place],  [place] [address]  HISTORICAL INFORMATION:   Selected notes from the MEDICAL RECORD NUMBER       CURRENT MEDICATIONS: Current Outpatient Medications (Ophthalmic Drugs)  Medication Sig   latanoprost (XALATAN) 0.005 % ophthalmic solution Place 1 drop into both eyes daily.   No current facility-administered medications for this visit. (Ophthalmic Drugs)   Current Outpatient Medications (Other)  Medication Sig   albuterol (PROVENTIL) (2.5 MG/3ML) 0.083% nebulizer solution Take 3 mLs (2.5 mg total) by nebulization every 6 (six) hours as needed for wheezing or shortness of breath.   amLODipine (NORVASC) 10 MG tablet Take 10 mg by mouth daily.   ANORO ELLIPTA 62.5-25 MCG/INH AEPB 1 puff daily.   azithromycin (ZITHROMAX) 250 MG tablet Take 2 tablets today then 1 tablet daily until gone   Calcium Carb-Cholecalciferol (CALCIUM 600 + D PO) Take 1 tablet by mouth daily.   Fluticasone-Umeclidin-Vilant (TRELEGY ELLIPTA) 100-62.5-25 MCG/INH AEPB Inhale into the lungs.   hydrochlorothiazide (HYDRODIURIL) 12.5 MG tablet Take 12.5 mg by mouth every morning.   hydrochlorothiazide (HYDRODIURIL) 25 MG tablet Take 25 mg by mouth  every morning.   ibuprofen (ADVIL) 800 MG tablet Take 800 mg by mouth 2 (two) times daily.   losartan (COZAAR) 50 MG tablet Take 50 mg by mouth daily.    lovastatin (MEVACOR) 40 MG tablet Take 40 mg by mouth every morning.    Multiple Vitamins-Minerals (MULTIVITAMIN PO) Take 1 tablet by mouth daily.   Multiple Vitamins-Minerals (PRESERVISION AREDS PO) Take 1 tablet by mouth 2 (two) times daily.   Omega-3 Fatty Acids (FISH OIL) 1200 MG CAPS Take 1,200 mg by mouth daily.   No current facility-administered medications for this visit. (Other)      REVIEW OF SYSTEMS: ROS   Negative for: Constitutional, Gastrointestinal, Neurological, Skin, Genitourinary, Musculoskeletal, HENT, Endocrine, Cardiovascular, Eyes, Respiratory, Psychiatric, Allergic/Imm, Heme/Lymph Last edited by Orene Desanctis D, CMA on 03/25/2022  9:54 AM.       ALLERGIES No Known Allergies  PAST MEDICAL HISTORY Past Medical History:  Diagnosis Date   Allergic rhinitis    Asthma    COPD (chronic obstructive pulmonary disease) ([place])    Hypertension    Macular degeneration    Past Surgical History:  Procedure Laterality Date   CATARACT EXTRACTION W/PHACO Left 11/03/2016   Dr. Katy Fitch   CATARACT EXTRACTION W/PHACO Right 11/22/2016   Dr. Katy Fitch   COLONOSCOPY WITH PROPOFOL N/A 03/12/2014   Procedure: COLONOSCOPY WITH PROPOFOL;  Surgeon: Garlan Fair, MD;  Location: [facility];  Service: Endoscopy;  Laterality: N/A;   LUMBAR SPINE SURGERY  x 2 times   repair deviated septum  40-50 yrs ago   ROTATOR CUFF REPAIR Left     FAMILY HISTORY Family History  Problem Relation Age of Onset   Prostate cancer Father    Macular degeneration Mother     SOCIAL HISTORY Social History   Tobacco Use   Smoking status: Former    Packs/day: 2.00    Years: 15.00    Total pack years: 30.00    Types: Cigarettes    Quit date: 07/14/1963    Years since quitting: 58.7   Smokeless tobacco: Never  Substance Use Topics    Alcohol use: No   Drug use: No         OPHTHALMIC EXAM:  Base Eye Exam     Visual Acuity (ETDRS)       Right Left   Dist [place] 20/30 +3 20/25 -1         Tonometry (Tonopen, 9:58 AM)       Right Left   Pressure 10 8         Pachymetry     06/26/2021         Extraocular Movement       Right Left    Ortho Ortho    -- -- --  --  --  -- -- --   -- -- --  --  --  -- -- --           Neuro/Psych     Oriented x3: Yes   Mood/Affect: Normal         Dilation     Left eye: 2.5% Phenylephrine, 1.0% Mydriacyl @ 9:55 AM           Slit Lamp and Fundus Exam     External Exam       Right Left   External Normal Normal         Slit Lamp Exam       Right Left   Lids/Lashes Normal Normal   Conjunctiva/Sclera White and quiet White and quiet   Cornea Clear Clear   Anterior Chamber Deep and quiet Deep and quiet   Iris Round and reactive Round and reactive   Lens Posterior chamber intraocular lens Posterior chamber intraocular lens   Anterior Vitreous Normal Normal         Fundus Exam       Right Left   Posterior Vitreous  Posterior vitreous detachment   Disc  Normal   C/D Ratio  0.5   Macula  Retinal pigment epithelial detachment, small, no hemorrhage, Macular thickening, Exudates   Vessels  Normal   Periphery  Normal            IMAGING AND PROCEDURES  Imaging and Procedures for 03/25/22  OCT, Retina - OU - Both Eyes       Right Eye Quality was good. Scan locations included subfoveal. Central Foveal Thickness: 334. Findings include abnormal foveal contour, choroidal neovascular membrane, disciform scar, subretinal fluid.   Left Eye Quality was good. Scan locations included subfoveal. Central Foveal Thickness: 269. Progression has been stable. Findings include abnormal foveal contour, subretinal hyper-reflective material, intraretinal hyper-reflective material.   Notes OD, overall active CNVM with  subretinal fluid through the  FAZ, at recent 5-week interval.  Got 2 days postinjection left subretinal fluid  We will maintain interval next to 6 weeks and and an exam next week as scheduled   OS with improved area of of CNVM superonasal FAZ, will commence  therapy with Avastin OS today       Intravitreal Injection, Pharmacologic Agent - OS - Left Eye       Time Out 03/25/2022. 10:45 AM. Confirmed correct patient, procedure, site, and patient consented.   Anesthesia Topical anesthesia was used. Anesthetic medications included Lidocaine 4%.   Procedure Preparation included 5% betadine to ocular surface, 10% betadine to eyelids. A 30 gauge needle was used.   Injection: 1.25 mg Bevacizumab 1.'25mg'$ /0.17m   Route: Intravitreal, Site: Left Eye   NDC: [id number] Lot: [id number] Expiration date: 05/27/2022   Post-op Post injection exam found visual acuity of at least counting fingers. The patient tolerated the procedure well. There were no complications. The patient received written and verbal post procedure care education. Post injection medications included gentamicin.              ASSESSMENT/PLAN:  Exudative age-related macular degeneration of left eye with active choroidal neovascularization (HCC) Improved overall post injection Avastin repeat injection today reevaluate next in 6 weeks  Exudative age-related macular degeneration of right eye with active choroidal neovascularization (HCC) Recurrent subretinal fluid OD at 5 weeks follow-up next week as scheduled  Serous detachment of retinal pigment epithelium of right eye Component of wet AMD     ICD-10-CM   1. Exudative age-related macular degeneration of left eye with active choroidal neovascularization (HCC)  H35.3221 OCT, Retina - OU - Both Eyes    Intravitreal Injection, Pharmacologic Agent - OS - Left Eye    Bevacizumab (AVASTIN) SOLN 1.25 mg    2. Exudative age-related macular degeneration of right eye with active choroidal neovascularization  ([place]  H35.3211     3. Serous detachment of retinal pigment epithelium of right eye  H35.721       1.  Repeat injection intravitreal Avastin OS today as condition has improved and stabilized.  2.  OD chronic recurrent serous retinal detachment stable acuity over time associated with wet AMD.  Follow-up as scheduled next week  3.  Ophthalmic Meds Ordered this visit:  Meds ordered this encounter  Medications   Bevacizumab (AVASTIN) SOLN 1.25 mg       Return in about 6 weeks (around 05/06/2022) for dilate, OS, AVASTIN OCT.  There are no Patient Instructions on file for this visit.   Explained the diagnoses, plan, and follow up with the patient and they expressed understanding.  Patient expressed understanding of the importance of proper follow up care.   GClent DemarkRankin M.D. Diseases & Surgery of the Retina and Vitreous Retina & Diabetic [place]09/13/23     Abbreviations: M myopia (nearsighted); A astigmatism; H hyperopia (farsighted); P presbyopia; Mrx spectacle prescription;  CTL contact lenses; OD right eye; OS left eye; OU both eyes  XT exotropia; ET esotropia; PEK punctate epithelial keratitis; PEE punctate epithelial erosions; DES dry eye syndrome; MGD meibomian gland dysfunction; ATs artificial tears; PFAT's preservative free artificial tears; [place] s[place] cataract; PSC posterior subcapsular cataract; ERM epi-retinal membrane; PVD posterior vitreous detachment; RD retinal detachment; DM diabetes mellitus; DR diabetic retinopathy; NPDR non-proliferative diabetic retinopathy; PDR proliferative diabetic retinopathy; CSME clinically significant macular edema; DME diabetic macular edema; dbh dot blot hemorrhages; CWS cotton wool spot; POAG primary open angle glaucoma; C/D cup-to-disc ratio; HVF humphrey visual field; GVF goldmann visual field; OCT optical coherence tomography; IOP intraocular pressure; BRVO Branch retinal vein occlusion; CRVO central retinal vein  occlusion; CRAO central retinal artery occlusion; BRAO branch retinal artery occlusion; RT retinal tear; SB scleral buckle; PPV pars plana  vitrectomy; VH Vitreous hemorrhage; PRP panretinal laser photocoagulation; IVK intravitreal kenalog; VMT vitreomacular traction; MH Macular hole;  NVD neovascularization of the disc; NVE neovascularization elsewhere; AREDS age related eye disease study; ARMD age related macular degeneration; POAG primary open angle glaucoma; EBMD epithelial/anterior basement membrane dystrophy; ACIOL anterior chamber intraocular lens; IOL intraocular lens; PCIOL posterior chamber intraocular lens; Phaco/IOL phacoemulsification with intraocular lens placement; [place] photorefractive keratectomy; LASIK laser assisted in situ keratomileusis; HTN hypertension; DM diabetes mellitus; COPD chronic obstructive pulmonary disease

## 2022-03-25 NOTE — Assessment & Plan Note (Signed)
Recurrent subretinal fluid OD at 5 weeks follow-up next week as scheduled

## 2022-03-30 ENCOUNTER — Ambulatory Visit (INDEPENDENT_AMBULATORY_CARE_PROVIDER_SITE_OTHER): Payer: PPO | Admitting: Ophthalmology

## 2022-03-30 ENCOUNTER — Encounter (INDEPENDENT_AMBULATORY_CARE_PROVIDER_SITE_OTHER): Payer: Self-pay | Admitting: Ophthalmology

## 2022-03-30 DIAGNOSIS — H35721 Serous detachment of retinal pigment epithelium, right eye: Secondary | ICD-10-CM | POA: Diagnosis not present

## 2022-03-30 DIAGNOSIS — H353211 Exudative age-related macular degeneration, right eye, with active choroidal neovascularization: Secondary | ICD-10-CM

## 2022-03-30 DIAGNOSIS — H353221 Exudative age-related macular degeneration, left eye, with active choroidal neovascularization: Secondary | ICD-10-CM | POA: Diagnosis not present

## 2022-03-30 MED ORDER — AFLIBERCEPT 2MG/0.05ML IZ SOLN FOR KALEIDOSCOPE
2.0000 mg | INTRAVITREAL | Status: AC | PRN
  Administered 2022-03-30: 2 mg via INTRAVITREAL

## 2022-03-30 NOTE — Assessment & Plan Note (Signed)
OS improved after most recent injections continue with therapy and evaluation as scheduled

## 2022-03-30 NOTE — Assessment & Plan Note (Signed)
Still active but with good acuity repeat injection Eylea today to maintain

## 2022-03-30 NOTE — Assessment & Plan Note (Signed)
Component of wet AMD continue the treat today at 6-week interval repeat [facility]

## 2022-03-30 NOTE — Progress Notes (Signed)
03/30/2022     CHIEF COMPLAINT Patient presents for  Chief Complaint  Patient presents with   Macular Degeneration      HISTORY OF PRESENT ILLNESS: Robert Doyle is a 88 y.o. male who presents to the clinic today for:   HPI   6 weeks for OD EYLEA OCT. Pt stated vision has remained stable.  Last edited by Silvestre Moment on 03/30/2022  1:55 PM.      Referring physician: Lajean Manes, MD [address],   [id number]  HISTORICAL INFORMATION:   Selected notes from the MEDICAL RECORD NUMBER       CURRENT MEDICATIONS: Current Outpatient Medications (Ophthalmic Drugs)  Medication Sig   latanoprost (XALATAN) 0.005 % ophthalmic solution Place 1 drop into both eyes daily.   No current facility-administered medications for this visit. (Ophthalmic Drugs)   Current Outpatient Medications (Other)  Medication Sig   albuterol (PROVENTIL) (2.5 MG/3ML) 0.083% nebulizer solution Take 3 mLs (2.5 mg total) by nebulization every 6 (six) hours as needed for wheezing or shortness of breath.   amLODipine (NORVASC) 10 MG tablet Take 10 mg by mouth daily.   ANORO ELLIPTA 62.5-25 MCG/INH AEPB 1 puff daily.   azithromycin (ZITHROMAX) 250 MG tablet Take 2 tablets today then 1 tablet daily until gone   Calcium Carb-Cholecalciferol (CALCIUM 600 + D PO) Take 1 tablet by mouth daily.   Fluticasone-Umeclidin-Vilant (TRELEGY ELLIPTA) 100-62.5-25 MCG/INH AEPB Inhale into the lungs.   hydrochlorothiazide (HYDRODIURIL) 12.5 MG tablet Take 12.5 mg by mouth every morning.   hydrochlorothiazide (HYDRODIURIL) 25 MG tablet Take 25 mg by mouth every morning.   ibuprofen (ADVIL) 800 MG tablet Take 800 mg by mouth 2 (two) times daily.   losartan (COZAAR) 50 MG tablet Take 50 mg by mouth daily.    lovastatin (MEVACOR) 40 MG tablet Take 40 mg by mouth every morning.    Multiple Vitamins-Minerals (MULTIVITAMIN PO) Take 1 tablet by mouth daily.   Multiple Vitamins-Minerals (PRESERVISION AREDS PO) Take 1  tablet by mouth 2 (two) times daily.   Omega-3 Fatty Acids (FISH OIL) 1200 MG CAPS Take 1,200 mg by mouth daily.   No current facility-administered medications for this visit. (Other)      REVIEW OF SYSTEMS: ROS   Negative for: Constitutional, Gastrointestinal, Neurological, Skin, Genitourinary, Musculoskeletal, HENT, Endocrine, Cardiovascular, Eyes, Respiratory, Psychiatric, Allergic/Imm, Heme/Lymph Last edited by Silvestre Moment on 03/30/2022  1:55 PM.       ALLERGIES No Known Allergies  PAST MEDICAL HISTORY Past Medical History:  Diagnosis Date   Allergic rhinitis    Asthma    COPD (chronic obstructive pulmonary disease) ([place])    Hypertension    Macular degeneration    Past Surgical History:  Procedure Laterality Date   CATARACT EXTRACTION W/PHACO Left 11/03/2016   Dr. Katy Fitch   CATARACT EXTRACTION W/PHACO Right 11/22/2016   Dr. Katy Fitch   COLONOSCOPY WITH PROPOFOL N/A 03/12/2014   Procedure: COLONOSCOPY WITH PROPOFOL;  Surgeon: Garlan Fair, MD;  Location: [facility];  Service: Endoscopy;  Laterality: N/A;   LUMBAR SPINE SURGERY     x 2 times   repair deviated septum  40-50 yrs ago   ROTATOR CUFF REPAIR Left     FAMILY HISTORY Family History  Problem Relation Age of Onset   Prostate cancer Father    Macular degeneration Mother     SOCIAL HISTORY Social History   Tobacco Use   Smoking status: Former    Packs/day: 2.00  Years: 15.00    Total pack years: 30.00    Types: Cigarettes    Quit date: 07/14/1963    Years since quitting: 58.7   Smokeless tobacco: Never  Substance Use Topics   Alcohol use: No   Drug use: No         OPHTHALMIC EXAM:  Base Eye Exam     Visual Acuity (ETDRS)       Right Left   Dist [place] 20/30 -2 20/25 -2   Dist ph [place] 20/25 -2          Tonometry (Tonopen, 2:00 PM)       Right Left   Pressure 15 14         Pachymetry     06/26/2021         Pupils       Pupils APD   Right PERRL None   Left PERRL None          Visual Fields       Left Right    Full Full         Extraocular Movement       Right Left    Full, Ortho Full, Ortho         Neuro/Psych     Oriented x3: Yes   Mood/Affect: Normal         Dilation     Right eye: 2.5% Phenylephrine, 1.0% Mydriacyl @ 2:00 PM           Slit Lamp and Fundus Exam     External Exam       Right Left   External Normal Normal         Slit Lamp Exam       Right Left   Lids/Lashes Normal Normal   Conjunctiva/Sclera White and quiet White and quiet   Cornea Clear Clear   Anterior Chamber Deep and quiet Deep and quiet   Iris Round and reactive Round and reactive   Lens Posterior chamber intraocular lens Posterior chamber intraocular lens   Anterior Vitreous Normal Normal         Fundus Exam       Right Left   Posterior Vitreous Posterior vitreous detachment    Disc Normal    C/D Ratio 0.5    Macula Retinal pigment epithelial mottling, serous macular thickening, no hemorrhage, no exudates, Pigmented atrophy    Vessels Normal, no signs of [place] plaque seen, as it was noted recent examination on 04/02/2020 and that plaque was present between eye nevus superotemporally and the macula    Periphery Normal, small flat nevus along the superotemporal arcade             IMAGING AND PROCEDURES  Imaging and Procedures for 03/30/22  OCT, Retina - OU - Both Eyes       Right Eye Quality was good. Scan locations included subfoveal. Central Foveal Thickness: 334. Findings include abnormal foveal contour, choroidal neovascular membrane, disciform scar, subretinal fluid.   Left Eye Quality was good. Scan locations included subfoveal. Central Foveal Thickness: 269. Progression has been stable. Findings include abnormal foveal contour, subretinal hyper-reflective material, intraretinal hyper-reflective material.   Notes OD, overall active CNVM with  subretinal fluid through the FAZ, at recent 6-week interval.   We will  maintain interval next to 6 weeks and injection today   OS with improved area of of CNVM superonasal FAZ, improved 5 days after most recent injection        Intravitreal  Injection, Pharmacologic Agent - OD - Right Eye       Time Out 03/30/2022. 2:14 PM. Confirmed correct patient, procedure, site, and patient consented.   Anesthesia Topical anesthesia was used. Anesthetic medications included Lidocaine 4%.   Procedure Preparation included 5% betadine to ocular surface, 10% betadine to eyelids, Tobramycin 0.3%. A 30 gauge needle was used.   Injection: 2 mg aflibercept 2 MG/0.05ML   Route: Intravitreal, Site: Right Eye   NDC: [id number], Lot: [id number], Expiration date: 04/13/2023, Waste: 0 mL   Post-op Post injection exam found visual acuity of at least counting fingers, perfused optic nerve. The patient tolerated the procedure well. There were no complications. The patient received written and verbal post procedure care education. Post injection medications included ocuflox.   Notes Some dimness post injection, patient weight in the office 3 minutes              ASSESSMENT/PLAN:  Serous detachment of retinal pigment epithelium of right eye Component of wet AMD continue the treat today at 6-week interval repeat Eylea  Exudative age-related macular degeneration of right eye with active choroidal neovascularization ([place]) Still active but with good acuity repeat injection Eylea today to maintain  Exudative age-related macular degeneration of left eye with active choroidal neovascularization ([place]) OS improved after most recent injections continue with therapy and evaluation as scheduled     ICD-10-CM   1. Exudative age-related macular degeneration of right eye with active choroidal neovascularization (HCC)  H35.3211 OCT, Retina - OU - Both Eyes    Intravitreal Injection, Pharmacologic Agent - OD - Right Eye    aflibercept (EYLEA) SOLN 2 mg    2. Serous detachment  of retinal pigment epithelium of right eye  H35.721     3. Exudative age-related macular degeneration of left eye with active choroidal neovascularization ([place])  H35.3221       1.  OD, wet AMD continues at 6-week interval good acuity.  Persistent subretinal fluid.  Repeat Eylea OD today and reevaluate next in 6 weeks  2.  OS doing well after recent injection reevaluate next as scheduled  3.  Ophthalmic Meds Ordered this visit:  Meds ordered this encounter  Medications   aflibercept (EYLEA) SOLN 2 mg       Return in about 6 weeks (around 05/11/2022) for dilate, OD, EYLEA OCT.  There are no Patient Instructions on file for this visit.   Explained the diagnoses, plan, and follow up with the patient and they expressed understanding.  Patient expressed understanding of the importance of proper follow up care.   Clent Demark Stamatia Masri M.D. Diseases & Surgery of the Retina and Vitreous Retina & Diabetic [place] 03/30/22     Abbreviations: M myopia (nearsighted); A astigmatism; H hyperopia (farsighted); P presbyopia; Mrx spectacle prescription;  CTL contact lenses; OD right eye; OS left eye; OU both eyes  XT exotropia; ET esotropia; PEK punctate epithelial keratitis; PEE punctate epithelial erosions; DES dry eye syndrome; MGD meibomian gland dysfunction; ATs artificial tears; PFAT's preservative free artificial tears; [place] nuclear sclerotic cataract; PSC posterior subcapsular cataract; ERM epi-retinal membrane; PVD posterior vitreous detachment; RD retinal detachment; DM diabetes mellitus; DR diabetic retinopathy; NPDR non-proliferative diabetic retinopathy; PDR proliferative diabetic retinopathy; CSME clinically significant macular edema; DME diabetic macular edema; dbh dot blot hemorrhages; CWS cotton wool spot; POAG primary open angle glaucoma; C/D cup-to-disc ratio; HVF humphrey visual field; GVF goldmann visual field; OCT optical coherence tomography; IOP intraocular pressure; BRVO Branch  retinal vein occlusion; CRVO central  retinal vein occlusion; CRAO central retinal artery occlusion; BRAO branch retinal artery occlusion; RT retinal tear; SB scleral buckle; PPV pars plana vitrectomy; VH Vitreous hemorrhage; PRP panretinal laser photocoagulation; IVK intravitreal kenalog; VMT vitreomacular traction; MH Macular hole;  NVD neovascularization of the disc; NVE neovascularization elsewhere; AREDS age related eye disease study; ARMD age related macular degeneration; POAG primary open angle glaucoma; EBMD epithelial/anterior basement membrane dystrophy; ACIOL anterior chamber intraocular lens; IOL intraocular lens; PCIOL posterior chamber intraocular lens; Phaco/IOL phacoemulsification with intraocular lens placement; [place] photorefractive keratectomy; LASIK laser assisted in situ keratomileusis; HTN hypertension; DM diabetes mellitus; COPD chronic obstructive pulmonary disease

## 2022-05-02 IMAGING — CT CT CHEST W/O CM
2 of 4 series · 15 of 36 positions shown, 18 images · non-contrast
Comparison: September 13, 2018.

CLINICAL DATA: Productive cough.

EXAM:
CT CHEST WITHOUT CONTRAST
TECHNIQUE: Multidetector CT imaging of the chest was performed following the
standard protocol without IV contrast.

[Series 2: thorax · axial · 0.80mm/px · z∈[-325,-33]mm · 12 of 174 slices shown, 15 images]
[im 14/174  mediastinal]
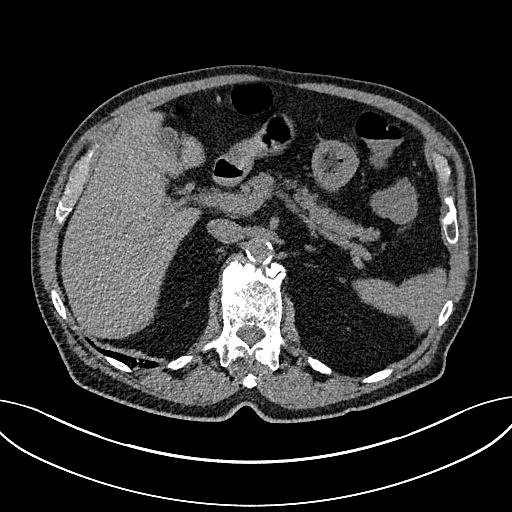
[im 14/174  lung]
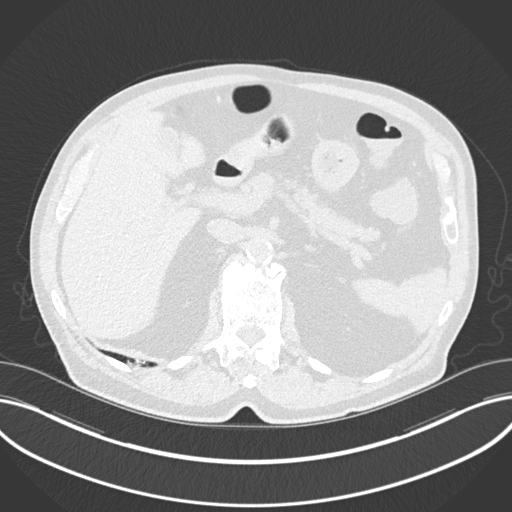
[im 27/174  lung]
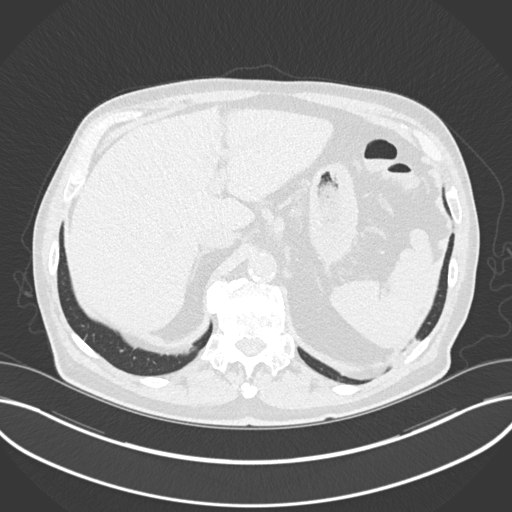
[im 40/174  lung]
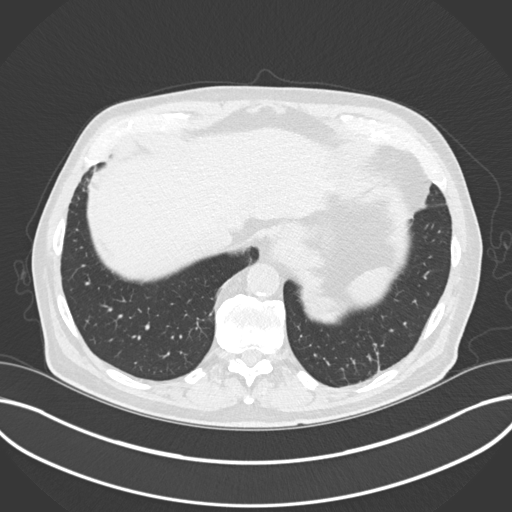
[im 54/174  lung]
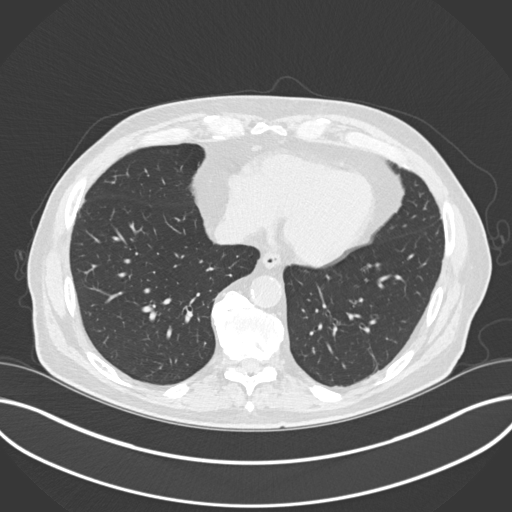
[im 67/174  mediastinal]
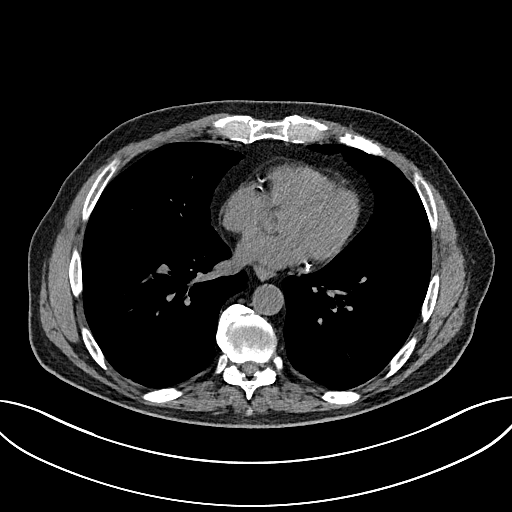
[im 67/174  lung]
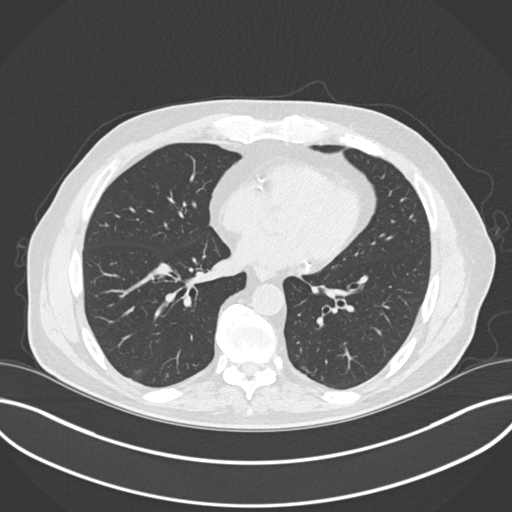
[im 80/174  lung]
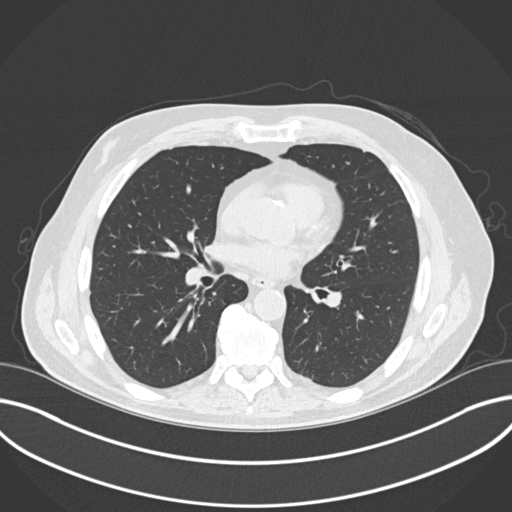
[im 94/174  lung]
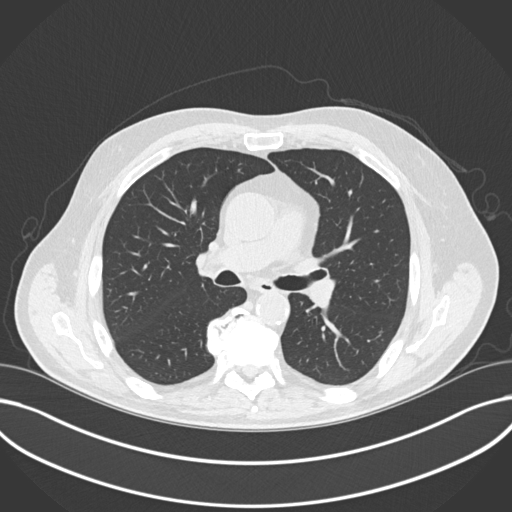
[im 107/174  lung]
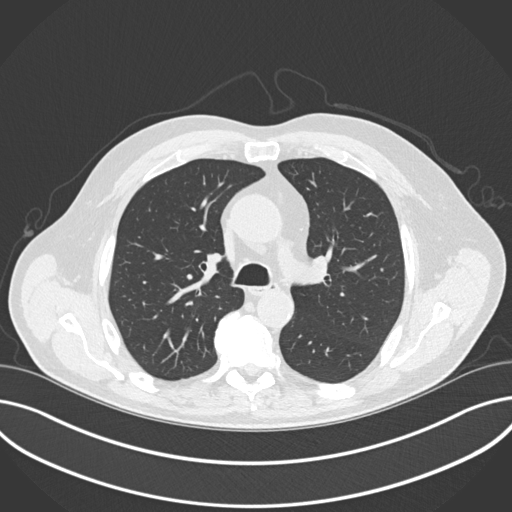
[im 120/174  mediastinal]
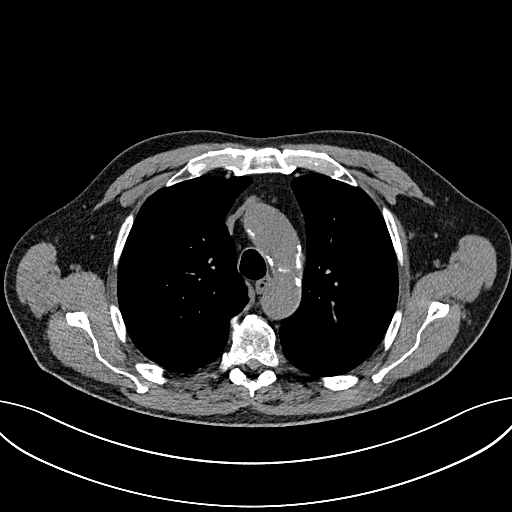
[im 120/174  lung]
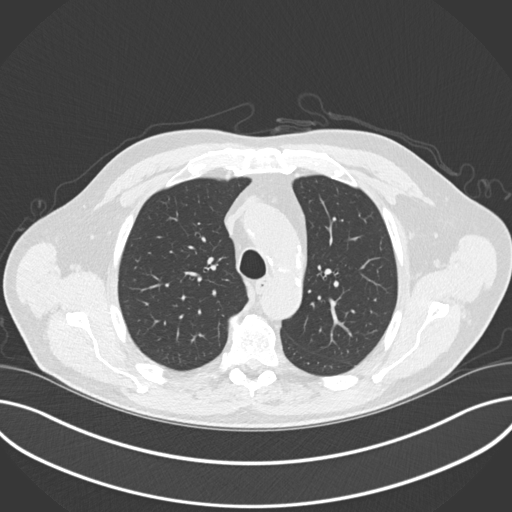
[im 134/174  lung]
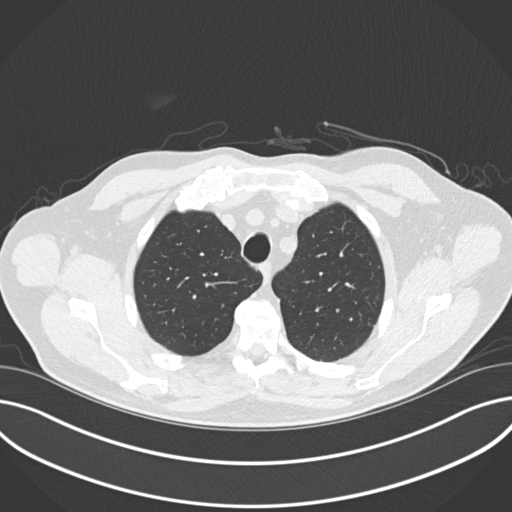
[im 147/174  lung]
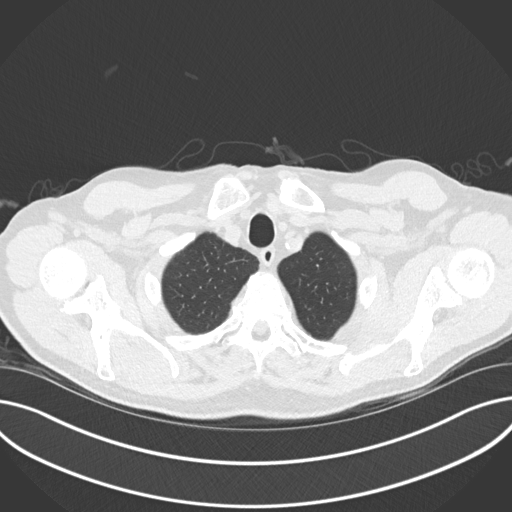
[im 160/174  lung]
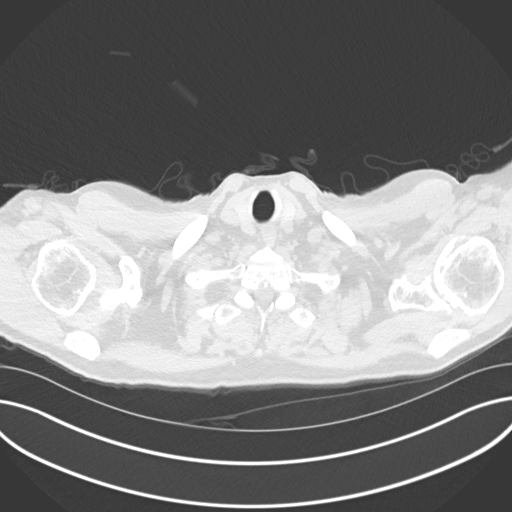

[Series 5: coronal · coronal · 0.69mm/px · 3 of 125 slices shown]
[im 25/125  lung]
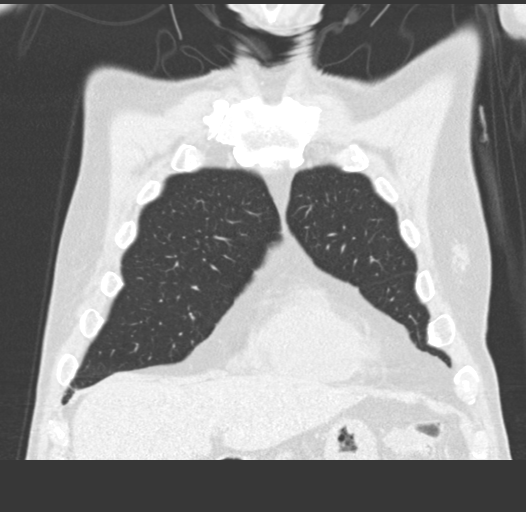
[im 50/125  lung]
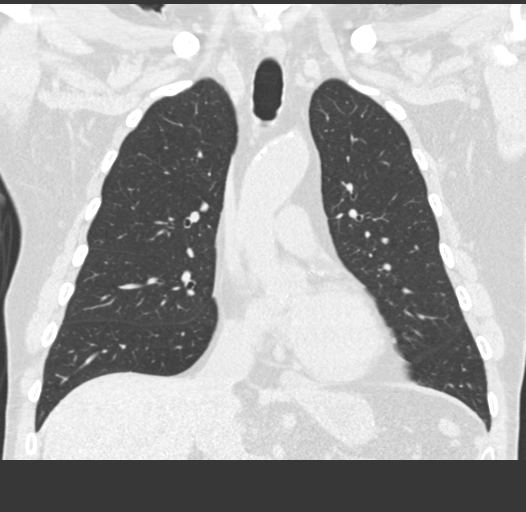
[im 75/125  lung]
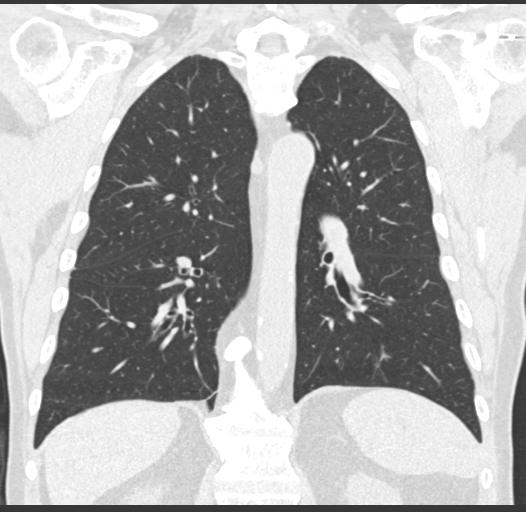

[15 of 36 positions shown; findings below may reference images not displayed]

FINDINGS: Cardiovascular: Atherosclerosis of thoracic aorta is noted without
aneurysm formation. Normal cardiac size. No pericardial effusion.
Moderate coronary artery calcifications are noted.

Mediastinum/Nodes: No enlarged mediastinal or axillary lymph nodes.
Thyroid gland, trachea, and esophagus demonstrate no significant
findings.

Lungs/Pleura: Lungs are clear. No pleural effusion or pneumothorax.

Upper Abdomen: No acute abnormality.

Musculoskeletal: No chest wall mass or suspicious bone lesions
identified.
IMPRESSION: No acute cardiopulmonary abnormality seen.

Moderate coronary artery calcifications are noted suggesting
coronary artery disease.

Aortic Atherosclerosis ([id number]).

## 2022-05-11 ENCOUNTER — Encounter (INDEPENDENT_AMBULATORY_CARE_PROVIDER_SITE_OTHER): Payer: PPO | Admitting: Ophthalmology

## 2022-05-11 DIAGNOSIS — H353211 Exudative age-related macular degeneration, right eye, with active choroidal neovascularization: Secondary | ICD-10-CM | POA: Diagnosis not present

## 2022-05-11 DIAGNOSIS — H353124 Nonexudative age-related macular degeneration, left eye, advanced atrophic with subfoveal involvement: Secondary | ICD-10-CM | POA: Diagnosis not present

## 2022-05-11 DIAGNOSIS — H35722 Serous detachment of retinal pigment epithelium, left eye: Secondary | ICD-10-CM | POA: Diagnosis not present

## 2022-05-11 DIAGNOSIS — H353221 Exudative age-related macular degeneration, left eye, with active choroidal neovascularization: Secondary | ICD-10-CM | POA: Diagnosis not present

## 2022-05-11 DIAGNOSIS — H353113 Nonexudative age-related macular degeneration, right eye, advanced atrophic without subfoveal involvement: Secondary | ICD-10-CM | POA: Diagnosis not present

## 2022-05-19 ENCOUNTER — Encounter (INDEPENDENT_AMBULATORY_CARE_PROVIDER_SITE_OTHER): Payer: PPO | Admitting: Ophthalmology

## 2022-05-19 DIAGNOSIS — H35721 Serous detachment of retinal pigment epithelium, right eye: Secondary | ICD-10-CM | POA: Diagnosis not present

## 2022-05-19 DIAGNOSIS — H353124 Nonexudative age-related macular degeneration, left eye, advanced atrophic with subfoveal involvement: Secondary | ICD-10-CM | POA: Diagnosis not present

## 2022-05-19 DIAGNOSIS — H353211 Exudative age-related macular degeneration, right eye, with active choroidal neovascularization: Secondary | ICD-10-CM | POA: Diagnosis not present

## 2022-05-19 DIAGNOSIS — H353221 Exudative age-related macular degeneration, left eye, with active choroidal neovascularization: Secondary | ICD-10-CM | POA: Diagnosis not present

## 2022-05-19 DIAGNOSIS — H353113 Nonexudative age-related macular degeneration, right eye, advanced atrophic without subfoveal involvement: Secondary | ICD-10-CM | POA: Diagnosis not present

## 2022-05-19 DIAGNOSIS — H34231 Retinal artery branch occlusion, right eye: Secondary | ICD-10-CM | POA: Diagnosis not present

## 2022-06-30 DIAGNOSIS — H35722 Serous detachment of retinal pigment epithelium, left eye: Secondary | ICD-10-CM | POA: Diagnosis not present

## 2022-06-30 DIAGNOSIS — H353124 Nonexudative age-related macular degeneration, left eye, advanced atrophic with subfoveal involvement: Secondary | ICD-10-CM | POA: Diagnosis not present

## 2022-06-30 DIAGNOSIS — H353221 Exudative age-related macular degeneration, left eye, with active choroidal neovascularization: Secondary | ICD-10-CM | POA: Diagnosis not present

## 2022-06-30 DIAGNOSIS — H353211 Exudative age-related macular degeneration, right eye, with active choroidal neovascularization: Secondary | ICD-10-CM | POA: Diagnosis not present

## 2022-06-30 DIAGNOSIS — H353113 Nonexudative age-related macular degeneration, right eye, advanced atrophic without subfoveal involvement: Secondary | ICD-10-CM | POA: Diagnosis not present

## 2022-07-07 DIAGNOSIS — H353211 Exudative age-related macular degeneration, right eye, with active choroidal neovascularization: Secondary | ICD-10-CM | POA: Diagnosis not present

## 2022-07-07 DIAGNOSIS — H35721 Serous detachment of retinal pigment epithelium, right eye: Secondary | ICD-10-CM | POA: Diagnosis not present

## 2022-07-07 DIAGNOSIS — H353113 Nonexudative age-related macular degeneration, right eye, advanced atrophic without subfoveal involvement: Secondary | ICD-10-CM | POA: Diagnosis not present

## 2022-07-07 DIAGNOSIS — H34231 Retinal artery branch occlusion, right eye: Secondary | ICD-10-CM | POA: Diagnosis not present

## 2022-07-07 DIAGNOSIS — H353124 Nonexudative age-related macular degeneration, left eye, advanced atrophic with subfoveal involvement: Secondary | ICD-10-CM | POA: Diagnosis not present

## 2022-07-14 DIAGNOSIS — I82401 Acute embolism and thrombosis of unspecified deep veins of right lower extremity: Secondary | ICD-10-CM | POA: Diagnosis not present

## 2022-07-14 DIAGNOSIS — R609 Edema, unspecified: Secondary | ICD-10-CM | POA: Diagnosis not present

## 2022-07-14 DIAGNOSIS — J449 Chronic obstructive pulmonary disease, unspecified: Secondary | ICD-10-CM | POA: Diagnosis not present

## 2022-07-14 DIAGNOSIS — I4891 Unspecified atrial fibrillation: Secondary | ICD-10-CM | POA: Diagnosis not present

## 2022-07-14 DIAGNOSIS — R000 Tachycardia, unspecified: Secondary | ICD-10-CM | POA: Diagnosis not present

## 2022-07-15 DIAGNOSIS — I82401 Acute embolism and thrombosis of unspecified deep veins of right lower extremity: Secondary | ICD-10-CM | POA: Diagnosis not present

## 2022-07-15 DIAGNOSIS — Z86718 Personal history of other venous thrombosis and embolism: Secondary | ICD-10-CM | POA: Diagnosis not present

## 2022-08-25 DIAGNOSIS — H35722 Serous detachment of retinal pigment epithelium, left eye: Secondary | ICD-10-CM | POA: Diagnosis not present

## 2022-08-25 DIAGNOSIS — H353221 Exudative age-related macular degeneration, left eye, with active choroidal neovascularization: Secondary | ICD-10-CM | POA: Diagnosis not present

## 2022-08-25 DIAGNOSIS — H35721 Serous detachment of retinal pigment epithelium, right eye: Secondary | ICD-10-CM | POA: Diagnosis not present

## 2023-11-25 ENCOUNTER — Emergency Department (HOSPITAL_COMMUNITY)
Admission: EM | Admit: 2023-11-25 | Discharge: 2023-11-25 | Disposition: A | Discharge status: Home / Self Care | Attending: Emergency Medicine | Admitting: Emergency Medicine

## 2023-11-25 ENCOUNTER — Emergency Department (HOSPITAL_COMMUNITY)

## 2023-11-25 DIAGNOSIS — I10 Essential (primary) hypertension: Secondary | ICD-10-CM | POA: Diagnosis not present

## 2023-11-25 DIAGNOSIS — D72829 Elevated white blood cell count, unspecified: Secondary | ICD-10-CM | POA: Diagnosis not present

## 2023-11-25 DIAGNOSIS — Z79899 Other long term (current) drug therapy: Secondary | ICD-10-CM | POA: Diagnosis not present

## 2023-11-25 DIAGNOSIS — R140 Abdominal distension (gaseous): Secondary | ICD-10-CM | POA: Insufficient documentation

## 2023-11-25 DIAGNOSIS — Z7951 Long term (current) use of inhaled steroids: Secondary | ICD-10-CM | POA: Diagnosis not present

## 2023-11-25 DIAGNOSIS — J449 Chronic obstructive pulmonary disease, unspecified: Secondary | ICD-10-CM | POA: Diagnosis not present

## 2023-11-25 DIAGNOSIS — R110 Nausea: Secondary | ICD-10-CM | POA: Diagnosis not present

## 2023-11-25 DIAGNOSIS — Z7901 Long term (current) use of anticoagulants: Secondary | ICD-10-CM | POA: Insufficient documentation

## 2023-11-25 DIAGNOSIS — R109 Unspecified abdominal pain: Secondary | ICD-10-CM

## 2023-11-25 LAB — COMPREHENSIVE METABOLIC PANEL WITH GFR
ALT: 34 U/L (ref 0–44)
AST: 35 U/L (ref 15–41)
Albumin: 4.6 g/dL (ref 3.5–5.0)
Alkaline Phosphatase: 75 U/L (ref 38–126)
Anion gap: 13 (ref 5–15)
BUN: 29 mg/dL — ABNORMAL HIGH (ref 8–23)
CO2: 23 mmol/L (ref 22–32)
Calcium: 9.8 mg/dL (ref 8.9–10.3)
Chloride: 104 mmol/L (ref 98–111)
Creatinine, Ser: 1.51 mg/dL — ABNORMAL HIGH (ref 0.61–1.24)
GFR, Estimated: 44 mL/min — ABNORMAL LOW (ref >60)
Glucose, Bld: 126 mg/dL — ABNORMAL HIGH (ref 70–99)
Potassium: 4 mmol/L (ref 3.5–5.1)
Sodium: 140 mmol/L (ref 135–145)
Total Bilirubin: 0.7 mg/dL (ref 0.0–1.2)
Total Protein: 8 g/dL (ref 6.5–8.1)

## 2023-11-25 LAB — URINALYSIS, ROUTINE W REFLEX MICROSCOPIC
Bacteria, UA: NONE SEEN
Bilirubin Urine: NEGATIVE
Glucose, UA: NEGATIVE mg/dL
Hgb urine dipstick: NEGATIVE
Ketones, ur: NEGATIVE mg/dL
Nitrite: NEGATIVE
Protein, ur: NEGATIVE mg/dL
Specific Gravity, Urine: 1.023 (ref 1.005–1.030)
pH: 5 (ref 5.0–8.0)

## 2023-11-25 LAB — CBC
HCT: 50.3 % (ref 39.0–52.0)
Hemoglobin: 16 g/dL (ref 13.0–17.0)
MCH: 28.4 pg (ref 26.0–34.0)
MCHC: 31.8 g/dL (ref 30.0–36.0)
MCV: 89.3 fL (ref 80.0–100.0)
Platelets: 251 10*3/uL (ref 150–400)
RBC: 5.63 MIL/uL (ref 4.22–5.81)
RDW: 13.2 % (ref 11.5–15.5)
WBC: 12.8 10*3/uL — ABNORMAL HIGH (ref 4.0–10.5)
nRBC: 0 % (ref 0.0–0.2)

## 2023-11-25 LAB — LIPASE, BLOOD: Lipase: 43 U/L (ref 11–51)

## 2023-11-25 LAB — TROPONIN I (HIGH SENSITIVITY): Troponin I (High Sensitivity): 5 ng/L (ref <18)

## 2023-11-25 MED ORDER — ONDANSETRON HCL 4 MG/2ML IJ SOLN
4.0000 mg | Freq: Once | INTRAMUSCULAR | Status: AC
  Administered 2023-11-25: 4 mg via INTRAVENOUS
  Filled 2023-11-25: qty 2

## 2023-11-25 MED ORDER — SODIUM CHLORIDE 0.9 % IV BOLUS
1000.0000 mL | Freq: Once | INTRAVENOUS | Status: AC
  Administered 2023-11-25: 1000 mL via INTRAVENOUS

## 2023-11-25 MED ORDER — IOHEXOL 350 MG/ML SOLN
75.0000 mL | Freq: Once | INTRAVENOUS | Status: AC | PRN
  Administered 2023-11-25: 65 mL via INTRAVENOUS

## 2023-11-25 MED ORDER — PANTOPRAZOLE SODIUM 20 MG PO TBEC: 20.0000 mg | DELAYED_RELEASE_TABLET | Freq: Every day | ORAL | 0 refills | Status: AC

## 2023-11-25 NOTE — ED Provider Triage Note (Signed)
 Emergency Medicine Provider Triage Evaluation Note  ISAIC BACHER , a 89 y.o. male  was evaluated in triage.  Pt complains of epig discomfort, nausea, insomnia. Pt here w/ family, reports feeling unwell for some weeks now. Early satiety, nausea w/o emesis, constipated, poor po intake, difficulty sleeping, reports his abdomen feels uneasy. Denies prior abd surgery  Review of Systems  Positive: nausea Negative: cp  Physical Exam  BP (!) 159/90 (BP Location: Right Arm)   Pulse 84   Temp 97.6 F (36.4 C)   Resp 18   SpO2 98%  Gen:   Awake, no distress, pacing around treatment area Resp:  Normal effort  MSK:   Moves extremities without difficulty  Other:  Abd distended  Medical Decision Making  Medically screening exam initiated at 10:35 AM.  Appropriate orders placed.  Gwendalyn Lemma was informed that the remainder of the evaluation will be completed by another provider, this initial triage assessment does not replace that evaluation, and the importance of remaining in the ED until their evaluation is complete.     Russella Courts A, DO 11/25/23 1035

## 2023-11-25 NOTE — Discharge Instructions (Signed)
 Please follow-up with your primary doctor.  Return medially for fevers, chills, sudden onset headache, chest pain, shortness of breath, worsening abdominal pain, inability eat or drink to nausea vomiting or any new or worsening symptoms that are concerning to you.

## 2023-11-25 NOTE — ED Provider Notes (Signed)
 [place] EMERGENCY DEPARTMENT AT [facility] Provider Note   CSN: [id number] Arrival date & time: 11/25/23  4098     History  Chief Complaint  Patient presents with   Nausea    Robert Doyle is a 89 y.o. male.  Patient is an 89 year old male with a past medical history of hypertension, COPD, VTE on Eliquis presenting to the emergency department with abdominal bloating and nausea.  Patient states over about the last month he has had abdominal bloating.  He states that it is constant was worse with eating.  He states he has associated nausea but has not vomited.  He states that he has chronic constipation and states he has not had a bowel movement in several days but also has not been eating much, states he is passing gas normally.  He denies any fevers, dysuria or hematuria.  Of note, the family reports that he has not been sleeping well despite taking melatonin at home.  The history is provided by the patient and a relative.       Home Medications Prior to Admission medications   Medication Sig Start Date End Date Taking? Authorizing Provider  albuterol  (PROVENTIL ) (2.5 MG/3ML) 0.083% nebulizer solution Take 3 mLs (2.5 mg total) by nebulization every 6 (six) hours as needed for wheezing or shortness of breath. 03/18/21   Hunsucker, Archer Kobs, MD  amLODipine (NORVASC) 10 MG tablet Take 10 mg by mouth daily. 09/10/19   [provider]  ANORO ELLIPTA 62.5-25 MCG/INH AEPB 1 puff daily. 08/24/19   [provider]  azithromycin  (ZITHROMAX ) 250 MG tablet Take 2 tablets today then 1 tablet daily until gone 11/22/14   Rosa College D, MD  Calcium Carb-Cholecalciferol (CALCIUM 600 + D PO) Take 1 tablet by mouth daily.    [provider]  Fluticasone -Umeclidin-Vilant (TRELEGY ELLIPTA) 100-62.5-25 MCG/INH AEPB Inhale into the lungs.    [provider]  hydrochlorothiazide (HYDRODIURIL) 12.5 MG tablet Take 12.5 mg by mouth every morning. 08/16/19    [provider]  hydrochlorothiazide (HYDRODIURIL) 25 MG tablet Take 25 mg by mouth every morning. 08/28/19   [provider]  ibuprofen (ADVIL) 800 MG tablet Take 800 mg by mouth 2 (two) times daily. 05/22/19   [provider]  latanoprost (XALATAN) 0.005 % ophthalmic solution Place 1 drop into both eyes daily.    [provider]  losartan (COZAAR) 50 MG tablet Take 50 mg by mouth daily.  05/07/12   [provider]  lovastatin (MEVACOR) 40 MG tablet Take 40 mg by mouth every morning.  04/29/11   [provider]  Multiple Vitamins-Minerals (MULTIVITAMIN PO) Take 1 tablet by mouth daily.    [provider]  Multiple Vitamins-Minerals (PRESERVISION AREDS PO) Take 1 tablet by mouth 2 (two) times daily.    [provider]  Omega-3 Fatty Acids (FISH OIL) 1200 MG CAPS Take 1,200 mg by mouth daily.    [provider]      Allergies    Patient has no known allergies.    Review of Systems   Review of Systems  Physical Exam Updated Vital Signs BP (!) 148/77 (BP Location: Right Arm)   Pulse (!) 53   Temp 97.6 F (36.4 C) (Oral)   Resp 19   SpO2 100%  Physical Exam Vitals and nursing note reviewed.  Constitutional:      General: He is not in acute distress.    Appearance: Normal appearance. He is obese.  HENT:  Head: Normocephalic and atraumatic.     Nose: Nose normal.     Mouth/Throat:     Mouth: Mucous membranes are moist.  Eyes:     Extraocular Movements: Extraocular movements intact.     Conjunctiva/sclera: Conjunctivae normal.  Cardiovascular:     Rate and Rhythm: Normal rate and regular rhythm.     Heart sounds: Normal heart sounds.  Pulmonary:     Effort: Pulmonary effort is normal.     Breath sounds: Normal breath sounds.  Abdominal:     General: There is distension (no palpable fluid wave).     Palpations: Abdomen is soft.     Tenderness: There is no abdominal tenderness.  Musculoskeletal:         General: Normal range of motion.     Cervical back: Normal range of motion.  Skin:    General: Skin is warm and dry.  Neurological:     General: No focal deficit present.     Mental Status: He is alert and oriented to person, place, and time.  Psychiatric:        Mood and Affect: Mood normal.        Behavior: Behavior normal.     ED Results / Procedures / Treatments   Labs (all labs ordered are listed, but only abnormal results are displayed) Labs Reviewed  COMPREHENSIVE METABOLIC PANEL WITH GFR - Abnormal; Notable for the following components:      Result Value   Glucose, Bld 126 (*)    BUN 29 (*)    Creatinine, Ser 1.51 (*)    GFR, Estimated 44 (*)    All other components within normal limits  CBC - Abnormal; Notable for the following components:   WBC 12.8 (*)    All other components within normal limits  URINALYSIS, ROUTINE W REFLEX MICROSCOPIC - Abnormal; Notable for the following components:   Leukocytes,Ua TRACE (*)    All other components within normal limits  LIPASE, BLOOD  TROPONIN I (HIGH SENSITIVITY)    EKG EKG Interpretation Date/Time:  Thursday Nov 25 2023 12:08:22 EDT Ventricular Rate:  53 PR Interval:  194 QRS Duration:  159 QT Interval:  479 QTC Calculation: 450 R Axis:   58  Text Interpretation: Sinus rhythm Right bundle branch block Now has RBBB and rate has slowed since last EKG in 2014 Confirmed by Celesta Coke ([id number]) on 11/25/2023 12:51:53 PM  Radiology DG Chest 1 View Result Date: 11/25/2023 CLINICAL DATA:  COPD EXAM: CHEST  1 VIEW COMPARISON:  X-ray 09/13/2018. Older x-rays. Noncontrast CT 02/26/2021 FINDINGS: No consolidation, pneumothorax or effusion. No edema. Normal cardiopericardial silhouette. Calcified aorta. Degenerative changes along the spine. Degenerative changes of the shoulders. IMPRESSION: No acute cardiopulmonary disease. Electronically Signed   By: Adrianna Horde M.D.   On: 11/25/2023 11:16    Procedures Procedures     Medications Ordered in ED Medications  sodium chloride  0.9 % bolus 1,000 mL (1,000 mLs Intravenous New Bag/Given 11/25/23 1337)  ondansetron  (ZOFRAN ) injection 4 mg (4 mg Intravenous Given 11/25/23 1336)  iohexol  (OMNIPAQUE ) 350 MG/ML injection 75 mL (65 mLs Intravenous Contrast Given 11/25/23 1426)    ED Course/ Medical Decision Making/ A&P                                 Medical Decision Making This patient presents to the ED with chief complaint(s) of nausea, bloating with pertinent past medical history of VTE  on Eliquis, COPD, HTN which further complicates the presenting complaint. The complaint involves an extensive differential diagnosis and also carries with it a high risk of complications and morbidity.    The differential diagnosis includes dehydration, electrolyte abnormality, cirrhosis, ascites, still passing gas making obstruction less likely, ileus, mass or malignancy, intra-abdominal infection less likely due to the chronicity of the pain, gastritis, GERD, constipation  Additional history obtained: Additional history obtained from family Records reviewed Primary Care Documents  ED Course and Reassessment: On patient's arrival he is hemodynamically stable in no acute distress.  He was initially evaluated in triage and had labs performed.  Patient's labs showed mild increased creatinine from his baseline and a mild leukocytosis.  Initial troponin was negative.  Symptoms have been ongoing for 1 month so single troponin is sufficient.  Urine is without signs of UTI.  EKG had no acute ischemic changes.  He has a CT of his abdomen and pelvis pending at this time.  He will be given fluids and nausea control and will be closely reassessed.  Independent labs interpretation:  The following labs were independently interpreted: mild leukocytosis, mild increased Cr from baseline labs several years ago  Independent visualization of imaging: - Pending   Amount and/or Complexity of  Data Reviewed Labs: ordered.  Risk Prescription drug management.          Final Clinical Impression(s) / ED Diagnoses Final diagnoses:  None    Rx / DC Orders ED Discharge Orders     None         Kingsley, Tiera Mensinger K, DO 11/25/23 1507

## 2023-11-25 NOTE — ED Triage Notes (Signed)
 Pt reports that for the last month he's had decreased PO intake d/t feeling nauseas after eating. Pt denies emesis. Endorses "nervous feeling" in his stomach but denies abd pain. Last BM approximately three days ago. Pt accompanied by daughter who reports that he's had poor sleep which prompted today's visit.

## 2023-11-25 NOTE — ED Provider Notes (Signed)
 Received signout.  Pending CT scan.  See prior team's note for full HPI.  CT scan resulted without abnormality.  Feels ready to go.  Will discharge with PPI and outpatient follow-up.   Rolinda Climes, DO 11/25/23 [phone]
